# Patient Record
Sex: Female | Born: 1965 | Race: White | Hispanic: No | Marital: Married | Smoking: Never smoker
Health system: Southern US, Community
[De-identification: ages and names within clinical notes are randomized; demographics above are authoritative.]

## PROBLEM LIST (undated history)

## (undated) DIAGNOSIS — R55 Syncope and collapse: Secondary | ICD-10-CM

## (undated) DIAGNOSIS — N920 Excessive and frequent menstruation with regular cycle: Secondary | ICD-10-CM

## (undated) DIAGNOSIS — Z973 Presence of spectacles and contact lenses: Secondary | ICD-10-CM

## (undated) DIAGNOSIS — Z8759 Personal history of other complications of pregnancy, childbirth and the puerperium: Secondary | ICD-10-CM

## (undated) DIAGNOSIS — R112 Nausea with vomiting, unspecified: Secondary | ICD-10-CM

## (undated) DIAGNOSIS — N840 Polyp of corpus uteri: Secondary | ICD-10-CM

## (undated) DIAGNOSIS — Z9889 Other specified postprocedural states: Secondary | ICD-10-CM

## (undated) DIAGNOSIS — M1990 Unspecified osteoarthritis, unspecified site: Secondary | ICD-10-CM

## (undated) HISTORY — DX: Unspecified osteoarthritis, unspecified site: M19.90

## (undated) HISTORY — PX: TONSILLECTOMY: SUR1361

## (undated) HISTORY — DX: Syncope and collapse: R55

---
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
## 2000-05-03 ENCOUNTER — Other Ambulatory Visit: Admission: RE | Admit: 2000-05-03 | Discharge: 2000-05-03 | Payer: Self-pay | Admitting: *Deleted

## 2000-05-27 ENCOUNTER — Encounter: Payer: Self-pay | Admitting: *Deleted

## 2000-05-27 ENCOUNTER — Ambulatory Visit (HOSPITAL_COMMUNITY): Admission: RE | Admit: 2000-05-27 | Discharge: 2000-05-27 | Payer: Self-pay | Admitting: *Deleted

## 2001-02-05 ENCOUNTER — Ambulatory Visit (HOSPITAL_COMMUNITY): Admission: RE | Admit: 2001-02-05 | Discharge: 2001-02-05 | Payer: Self-pay | Admitting: *Deleted

## 2001-02-05 HISTORY — PX: PELVIC LAPAROSCOPY: SHX162

## 2001-11-17 ENCOUNTER — Other Ambulatory Visit: Admission: RE | Admit: 2001-11-17 | Discharge: 2001-11-17 | Payer: Self-pay | Admitting: *Deleted

## 2002-05-30 ENCOUNTER — Inpatient Hospital Stay (HOSPITAL_COMMUNITY): Admission: AD | Admit: 2002-05-30 | Discharge: 2002-06-02 | Payer: Self-pay | Admitting: *Deleted

## 2002-05-30 ENCOUNTER — Encounter (INDEPENDENT_AMBULATORY_CARE_PROVIDER_SITE_OTHER): Payer: Self-pay | Admitting: Specialist

## 2002-06-12 ENCOUNTER — Encounter (HOSPITAL_COMMUNITY): Admission: RE | Admit: 2002-06-12 | Discharge: 2002-07-12 | Payer: Self-pay | Admitting: Gynecology

## 2005-03-07 ENCOUNTER — Other Ambulatory Visit: Admission: RE | Admit: 2005-03-07 | Discharge: 2005-03-07 | Payer: Self-pay | Admitting: Gynecology

## 2006-11-13 ENCOUNTER — Other Ambulatory Visit: Admission: RE | Admit: 2006-11-13 | Discharge: 2006-11-13 | Payer: Self-pay | Admitting: Obstetrics and Gynecology

## 2007-12-08 ENCOUNTER — Other Ambulatory Visit: Admission: RE | Admit: 2007-12-08 | Discharge: 2007-12-08 | Payer: Self-pay | Admitting: Gynecology

## 2008-12-17 ENCOUNTER — Ambulatory Visit: Payer: Self-pay | Admitting: Women's Health

## 2008-12-17 ENCOUNTER — Encounter: Payer: Self-pay | Admitting: Women's Health

## 2008-12-17 ENCOUNTER — Other Ambulatory Visit: Admission: RE | Admit: 2008-12-17 | Discharge: 2008-12-17 | Payer: Self-pay | Admitting: Obstetrics and Gynecology

## 2010-02-22 ENCOUNTER — Other Ambulatory Visit: Admission: RE | Admit: 2010-02-22 | Discharge: 2010-02-22 | Payer: Self-pay | Admitting: Obstetrics and Gynecology

## 2010-02-22 ENCOUNTER — Ambulatory Visit: Payer: Self-pay | Admitting: Women's Health

## 2011-02-16 NOTE — Discharge Summary (Signed)
   NAME:  Cathy Kirby, Cathy Kirby                   ACCOUNT NO.:  [id number]   MEDICAL RECORD NO.:  [id number]                   PATIENT TYPE:  INP   LOCATION:  9139                                 [facility]ILITY:  [facility]   PHYSICIAN:  Timothy P. Fontaine, M.D.           DATE OF BIRTH:  1965/11/13   DATE OF ADMISSION:  05/30/2002  DATE OF DISCHARGE:  06/02/2002                                 DISCHARGE SUMMARY   DISCHARGE DIAGNOSES:  1. Pregnancy 36 weeks.  2. Premature rupture of membranes.  3. Subsequent contractions.  4. History of previous cesarean section, desires repeat.   PROCEDURE:  Repeat low cervical transverse cesarean section with delivery of  viable infant.   HISTORY OF PRESENT ILLNESS:  The patient is a 45 year old gravida 4, para 1-  0-1-1, LMP September 18, 2001, [facility] June 23, 2002.  Prenatal risk factors  include history of previous ectopic pregnancy, history of previous cesarean  section, history of positive GBS with first pregnancy.   LABORATORIES:  Blood type O+.  Antibody screen negative.  RPR, HBSAG,  HIV  nonreactive.   HOSPITAL COURSE:  The patient was admitted at [redacted] weeks gestation secondary  to premature rupture of membranes and contractions for repeat cesarean  section.  Procedure was performed by Dr. Colin Broach with scrub  technician as assistant.  Findings included delivery of normal female infant,  Apgars 9/9, weight 6 pounds 5 ounces.  Left tube and ovary noted to be  absent.  Right adnexa normal.  Uterus was grossly normal.  Postpartum course  patient remained afebrile.  Had no difficulty voiding.  Was able to be  discharged in satisfactory condition on her third postoperative day.  Postoperative hemoglobin 11.   DISPOSITION:  Follow up in six weeks.  Continue prenatal vitamins and iron,  over-the-counter Motrin for pain.     Elwyn Lade . Hancock, N.P.                Timothy P. Audie Box, M.D.    MKH/MEDQ  D:  07/09/2002  T:  07/10/2002  Job:   [id number]

## 2011-02-16 NOTE — H&P (Signed)
[facility]  Patient:    Cathy Kirby, Cathy Kirby                  MRN: [id number] Adm. Date:  01/30/01 Attending:  Katy Fitch, M.D.                         History and Physical  CHIEF COMPLAINT:              Secondary infertility.  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 The patient had a cesarean section in 1992 with a normal pregnancy, and the following year she had a laparoscopy done for a tubal pregnancy on the right side, for which she was also treated with methotrexate. The year after that, the patient stated that she did have a spontaneous miscarriage and that she did have a pregnancy documented on ultrasound after this tubal surgery. The patient had a left salpingo-oophorectomy as a child due to an ovarian cyst and torsion. The patient was seen in the office and worked up for her secondary infertility. The patients husband has a normal semen analysis and she is ovulatory with a midluteal progesterone of 12.8. The patient also had a hysterosalpingogram which was seen to be patent right tube without any evidence of occlusion or hydrosalpinx. The patient is having periods currently every 28 days with four days of flow and no other problems.  PAST MEDICAL HISTORY:         None.  PAST SURGICAL HISTORY:        In 1982, laparotomy for LSO for cyst and torsion. 1992: C-section. 1993: Laparoscopy for ectopic.  MEDICATIONS:                  Multivitamin.  ALLERGIES:                    None.  SOCIAL HISTORY:               Patient is married. She denies any tobacco, alcohol, or drugs.  FAMILY HISTORY:               Without any epithelial cancers.  PHYSICAL EXAMINATION:  VITAL SIGNS:                  Blood pressure 112/78.  HEENT:                        Throat clear.  NECK:                         Without any thyromegaly or JVD.  LUNGS:                         Clear to auscultation bilaterally.  HEART:                        Regular rate and rhythm without any murmurs, rubs, or gallops.  BREASTS:                      Symmetric, nontender.  ABDOMEN:                      Soft, nontender without masses.  CERVIX:  Without any gross lesions.  UTERUS:                       Firm, mobile, nontender. Adnexa without any masses, nontender.  ASSESSMENT AND PLAN:          This is a 45 year old G3, P1, ectopic 1, with secondary infertility. The patient most likely has a tubal factor. The patient will undergo laparoscopic evaluation with CO2 laser standby and chromopertubation. Risks of procedure explained to patient including bleeding, transfusion, infection, scar tissue, wound breakdown, damage to the internal organs, fistula formation, possible continued infertility, anesthetic complications, blood clots, stroke, pulmonary embolism, and death. It was also explained to that patient if there is a hydrosalpinx noted, that we will probably occlude the tube secondary to her having a risk for ectopic pregnancy and if she desires to do in vitro fertilization in the future, that she will have a higher success rate with the occlusion. The patient does not want to undergo any laparotomy if scar tissue is present and there is any possible means of having to open the abdomen to remove scar tissue; that she would rather leave it and not undergo that procedure for her fertility. The patient states that she has read a lot about in vitro and at this point, does want to undergo this; however, she states that occluding the tube would be something that she would be agreeable to secondary to potential use in the future with in vitro and for sterility reasons. DD:  01/30/01 TD:  01/30/01 Job: [id number]

## 2011-02-16 NOTE — Op Note (Signed)
NAME:  Cathy Kirby, Cathy Kirby                   ACCOUNT NO.:  [id number]   MEDICAL RECORD NO.:  [id number]                   PATIENT TYPE:  INP   LOCATION:  NA                                   [facility]ILITY:  [facility]   PHYSICIAN:  Timothy P. Fontaine, M.D.           DATE OF BIRTH:  1965/12/31   DATE OF PROCEDURE:  05/30/2002  DATE OF DISCHARGE:                                 OPERATIVE REPORT   PREOPERATIVE DIAGNOSES:  1. Pregnancy at 36 weeks' gestation.  2. Premature rupture of membranes, subsequent contractions.  3. Prior cesarean section; desires repeat cesarean section.   POSTOPERATIVE DIAGNOSES:  1. Pregnancy at 36 weeks' gestation.  2. Premature rupture of membranes, subsequent contractions.  3. Prior cesarean section; desires repeat cesarean section.   PROCEDURE:  Repeat low transverse cervical cesarean section.   SURGEON:  Timothy P. Fontaine, M.D.   ASSISTANT:  Scrub technician.   ANESTHESIA:  Spinal.   ESTIMATED BLOOD LOSS:  Less than 500 cc.   COMPLICATIONS:  None.   SPECIMENS:  Samples of cord blood, placenta.   FINDINGS:  At 46 normal female infant, Apgars 9 and 9.  Weight 6 pounds, 5  ounces.  Left tube and ovary noted to be absent.  Right adnexa normal.  Uterus grossly normal.   DESCRIPTION OF PROCEDURE:  The patient was taken to the operating room and  underwent spinal anesthesia.  She was placed in the left tilt supine  position.  Her panniculus was taped to expose her prior low transverse scar,  and the abdomen was prepared with Betadine solution.  The bladder was  emptied with an indwelling Foley catheter and placed in sterile technique.  The patient was draped in the usual fashion.  After assuring adequate  anesthesia, the abdomen was sharply entered through a repeat Pfannenstiel  incision, achieving adequate anesthesia at all levels.  The bladder flap was  sharply and bluntly developed without difficulty, and the uterus was sharply  entered in the lower  uterine segment and bluntly extended laterally.  The  bulging membranes were ruptured.  Fluid was noted to be clear.  The infant's  head was delivered through the incision and nares and mouth suctioned.  The  rest of the infant delivered.  The cord was clamped and cut, and the infant  handed to pediatrics in attendance.  Samples of cord blood were obtained.  The placenta was spontaneously extruded and noted to be intact and was sent  to pathology.  The patient received 1 g of Cefotan IV prophylaxis at this  time.  The uterus was exteriorized noting an absent left adnexa.  The  endometrial cavity was explored with a sponge to remove all placental and  membrane fragments.  The uterine incision was then closed in one layer using  0 Vicryl suture in a running stitch and an interrupted figure-of-eight  suture was placed in the midline to achieve ultimate hemostasis.  The uterus  was then  returned to the abdomen which was copiously irrigated.  Adequate  hemostasis visualized.  The anterior fascia was reapproximated using 0  Vicryl suture in a running stitch starting at the angle and meeting in the  middle.  The subcutaneous tissues were irrigated.  Adequate hemostasis was  achieved with electrocautery, and the skin was reapproximated using staples  with intervening Steri-Strips with Benzoin.  The patient's sterile dressing  was applied.  She was taken to the recovery room in good condition having  tolerated the procedure well.                                               Timothy P. Audie Box, M.D.    TPF/MEDQ  D:  05/30/2002  T:  06/01/2002  Job:  [id number]

## 2011-02-16 NOTE — Op Note (Signed)
[facility]  Patient:    Cathy Kirby, Cathy Kirby                  MRN: [id number] Proc. Date: 02/05/01 Adm. Date:  [id number] Attending:  Wetzel Bjornstad                           Operative Report  PREOPERATIVE DIAGNOSIS:       Secondary infertility, rule out tubal factor.  POSTOPERATIVE DIAGNOSIS:      Secondary infertility, rule out tubal factor.  OPERATION:                    Diagnostic laparoscopy.  SURGEON:                      Katy Fitch, M.D.  ANESTHESIA:                   General.  ESTIMATED BLOOD LOSS:         Less than 10 cc.  FLUIDS:                       1100 crystalloid.  Urine output 300 clear.  COMPLICATIONS:                None.  SPECIMENS:                    None.  FINDINGS:                     Normal uterus.  Left tube and ovaries surgically removed.  Right tube and ovary appeared normal.  Bowel is normal.  Liver edge smooth and normal.  Anterior and posterior cul-de-sac free of adhesions or endometriosis.  DESCRIPTION OF PROCEDURE:     The patient was taken to the operating room and placed in [place] stirrups.  The patient was given general anesthesia until adequate.  The patient was then prepped and draped in normal sterile fashion. A Foley catheter was placed in the bladder, and clear urine return was noted. A bivalve speculum was placed in the vagina and the anterior lip of the cervix grasped with a single-tooth tenaculum, and an acorn uterine manipulator was placed inside the uterus and attached to the single-tooth tenaculum.  A vertical umbilical incision was made with a #11 blade, and the subcutaneous tissue was dissected with a hemostat down to the fascia.  The fascia was grasped with Kocher clamps and tented up to the incision and incised with a #15 blade.  Two 0 Vicryl sutures were placed on either side of the fascial incision for retention, and Army-Navy retractors were placed down inside, and entrance into  the peritoneal cavity was noted.   A Hasson 10 mm port was then placed into the umbilical incision and tied down using 0 Vicryl sutures. Carbon dioxide gas was instilled through the Hasson, and the camera device was assembled, weight balanced, and placed into the abdominal cavity.  On inspection, it was noted that there were some periumbilical adhesions initially but were filmy and were taken down with the tip of the camera.  Then clear inspection of the pelvis was noted.  The left tube and ovary were surgically removed.  The anterior and posterior cul-de-sac were clear of disease.  The right tube and ovary appeared normal.  A 5 mm suprapubic port was placed under direct visualization using a  skin knife, and then a 5 mm port was placed, and a probe was used to remove the sigmoid bowel out of the way in the posterior cul-de-sac to get adequate visualization.  The ovarian fossa on the left and right appeared to be normal.  Attempts at chromopertubation were made; however, dye spilled around the acorn out of the cervix and was not able to be performed.  However, the patient did have a prior HSG which showed normal spillage of the tube on the right side.  The patient had no other adhesions, endometriosis, or operative findings, so the 5 mm port was then removed under direct visualization, and hemostasis was noted.  Then the 10 mm port was removed.  The 0 Vicryl sutures were then tied down, and the fascia layer was closed.  The skin was closed using a 4-0 Vicryl in a subcuticular stitch, and the patient was taken to the recovery room in stable condition. DD:  02/05/01 TD:  02/05/01 Job: [id number]

## 2011-04-24 ENCOUNTER — Other Ambulatory Visit: Payer: Self-pay | Admitting: *Deleted

## 2011-04-24 DIAGNOSIS — N943 Premenstrual tension syndrome: Secondary | ICD-10-CM

## 2011-04-24 MED ORDER — FLUOXETINE HCL 10 MG PO CAPS: 10.0000 mg | ORAL_CAPSULE | Freq: Every day | ORAL | Status: DC

## 2011-04-24 NOTE — Telephone Encounter (Signed)
rx called in instead of faxed in. Due to protocol. kw

## 2011-05-28 ENCOUNTER — Other Ambulatory Visit: Payer: Self-pay | Admitting: Women's Health

## 2011-06-05 DIAGNOSIS — R768 Other specified abnormal immunological findings in serum: Secondary | ICD-10-CM | POA: Insufficient documentation

## 2011-06-05 DIAGNOSIS — R7689 Other specified abnormal immunological findings in serum: Secondary | ICD-10-CM | POA: Insufficient documentation

## 2011-06-05 DIAGNOSIS — Z9852 Vasectomy status: Secondary | ICD-10-CM | POA: Insufficient documentation

## 2011-06-13 ENCOUNTER — Ambulatory Visit (INDEPENDENT_AMBULATORY_CARE_PROVIDER_SITE_OTHER): Payer: BC Managed Care – PPO | Admitting: Women's Health

## 2011-06-13 ENCOUNTER — Other Ambulatory Visit (HOSPITAL_COMMUNITY)
Admission: RE | Admit: 2011-06-13 | Discharge: 2011-06-13 | Disposition: A | Discharge status: Home / Self Care | Payer: BC Managed Care – PPO | Source: Ambulatory Visit | Attending: Women's Health | Admitting: Women's Health

## 2011-06-13 ENCOUNTER — Encounter: Payer: Self-pay | Admitting: Women's Health

## 2011-06-13 VITALS — BP 120/80 | Ht 62.0 in | Wt 208.0 lb

## 2011-06-13 DIAGNOSIS — F419 Anxiety disorder, unspecified: Secondary | ICD-10-CM

## 2011-06-13 DIAGNOSIS — F411 Generalized anxiety disorder: Secondary | ICD-10-CM

## 2011-06-13 DIAGNOSIS — Z01419 Encounter for gynecological examination (general) (routine) without abnormal findings: Secondary | ICD-10-CM | POA: Insufficient documentation

## 2011-06-13 DIAGNOSIS — R82998 Other abnormal findings in urine: Secondary | ICD-10-CM

## 2011-06-13 DIAGNOSIS — Z833 Family history of diabetes mellitus: Secondary | ICD-10-CM

## 2011-06-13 MED ORDER — FLUOXETINE HCL 10 MG PO CAPS: 10.0000 mg | ORAL_CAPSULE | Freq: Every day | ORAL | Status: DC

## 2011-06-13 NOTE — Progress Notes (Signed)
Cathy Kirby [birth year] [id number]    History:    The patient presents for annual exam.  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 44 year old son Cathy Kirby doing well at [employer] 76-year-old son Cathy Kirby is in third-grade.   Past medical history, past surgical history, family history and social history were all reviewed and documented in the EPIC chart.   ROS:  A  ROS was performed and pertinent positives and negatives are included in the history.  Exam:  Filed Vitals:   06/13/11 1526  BP: 120/80    General appearance:  Normal Head/Neck:  Normal, without cervical or supraclavicular adenopathy. Thyroid:  Symmetrical, normal in size, without palpable masses or nodularity. Respiratory  Effort:  Normal  Auscultation:  Clear without wheezing or rhonchi Cardiovascular  Auscultation:  Regular rate, without rubs, murmurs or gallops  Edema/varicosities:  Not grossly evident Abdominal  Soft,nontender, without masses, guarding or rebound.  Liver/spleen:  No organomegaly noted  Hernia:  None appreciated  Skin  Inspection:  Grossly normal  Palpation:  Grossly normal Neurologic/psychiatric  Orientation:  Normal with appropriate conversation.  Mood/affect:  Normal  Genitourinary    Breasts: Examined lying and sitting.     Right: Without masses, retractions, discharge or axillary adenopathy.     Left: Without masses, retractions, discharge or axillary adenopathy.   Inguinal/mons:  Normal without inguinal adenopathy  External genitalia:  Normal  BUS/Urethra/Skene's glands:  Normal  Bladder:  Normal  Vagina:  Normal  Cervix:  Normal  Uterus:  , normal in size, shape and contour.  Midline and mobile  Adnexa/parametria:     Rt: Without masses or tenderness.   Lt: Without masses or tenderness.  Anus and perineum: Normal  Digital rectal exam: Normal sphincter tone without palpated masses or tenderness  Assessment/Plan:  45 y.o. MWF G4 P2 for annual exam. Monthly 3-4  day cycle/ vasectomy, no complaints. SBEs, has not had a mammogram, and did review importance of an annual screening, breast center information was given with phone number will get scheduled. Long discussion on weight loss, Weight Watchers was encouraged decreasing calories, increasing exercise for weight loss. CBC, glucose, UA and Pap. Will return to the office for a fasting lipid profile. She has had an elevated triglyceride in the past was seen by primary care who did not want to initiate medication. She is currently taking fish oil and will continue, increase fiber rich foods to greater than 20 g per day was encouraged and decreasing saturated fat to less than 20 g per day.     Harrington Challenger [facility], 5:18 PM 06/13/2011

## 2011-06-13 NOTE — Patient Instructions (Addendum)
<  20g saturated fat   >20g fiber /day

## 2011-06-29 ENCOUNTER — Other Ambulatory Visit: Payer: Self-pay | Admitting: Women's Health

## 2011-11-01 ENCOUNTER — Encounter: Payer: Self-pay | Admitting: *Deleted

## 2011-11-01 NOTE — Progress Notes (Signed)
Patient ID: Cathy Kirby, female   DOB: 1966/03/20, 46 y.o.   MRN: [id number] Pt called wanting to know the name and number of last mammogram. [facility] [phone] left on pt voicemail.

## 2011-11-08 ENCOUNTER — Encounter: Payer: Self-pay | Admitting: Women's Health

## 2012-11-25 ENCOUNTER — Other Ambulatory Visit: Payer: Self-pay | Admitting: Women's Health

## 2012-11-27 ENCOUNTER — Other Ambulatory Visit: Payer: Self-pay | Admitting: Women's Health

## 2012-12-15 ENCOUNTER — Ambulatory Visit: Payer: BC Managed Care – PPO | Admitting: Women's Health

## 2012-12-15 ENCOUNTER — Encounter: Payer: Self-pay | Admitting: Women's Health

## 2012-12-15 VITALS — BP 124/74 | Ht 62.25 in | Wt 203.0 lb

## 2012-12-15 DIAGNOSIS — F329 Major depressive disorder, single episode, unspecified: Secondary | ICD-10-CM

## 2012-12-15 DIAGNOSIS — Z01419 Encounter for gynecological examination (general) (routine) without abnormal findings: Secondary | ICD-10-CM

## 2012-12-15 DIAGNOSIS — F32A Depression, unspecified: Secondary | ICD-10-CM

## 2012-12-15 MED ORDER — FLUOXETINE HCL 10 MG PO CAPS: 10.0000 mg | ORAL_CAPSULE | Freq: Every day | ORAL | Status: DC

## 2012-12-15 NOTE — Progress Notes (Addendum)
Cathy Kirby 03-01-1966 [id number]    History:    The patient presents for annual exam.  Monthly 3-4d  Cycle/vasectomy. Normal mammogram and Pap history. Had normal labs at primary care this past year. Positive ANA/2006, negative workup with rheumatologist.  Past medical history, past surgical history, family history and social history were all reviewed and documented in the EPIC chart. Works with  early intervention birth to 3/developmental therapy. Cathy Kirby [address],  [place] 10 both doing well. Ectopic 89, LSO for cyst and torsion 83. Father diabetes, died of lung cancer.   ROS:  A  ROS was performed and pertinent positives and negatives are included in the history.  Exam:  Filed Vitals:   12/15/12 1405  BP: 124/74    General appearance:  Normal Head/Neck:  Normal, without cervical or supraclavicular adenopathy. Thyroid:  Symmetrical, normal in size, without palpable masses or nodularity. Respiratory  Effort:  Normal  Auscultation:  Clear without wheezing or rhonchi Cardiovascular  Auscultation:  Regular rate, without rubs, murmurs or gallops  Edema/varicosities:  Not grossly evident Abdominal  Soft,nontender, without masses, guarding or rebound.  Liver/spleen:  No organomegaly noted  Hernia:  None appreciated  Skin  Inspection:  Grossly normal  Palpation:  Grossly normal Neurologic/psychiatric  Orientation:  Normal with appropriate conversation.  Mood/affect:  Normal  Genitourinary    Breasts: Examined lying and sitting.     Right: Without masses, retractions, discharge or axillary adenopathy.     Left: Without masses, retractions, discharge or axillary adenopathy.   Inguinal/mons:  Normal without inguinal adenopathy  External genitalia:  Normal  BUS/Urethra/Skene's glands:  Normal  Bladder:  Normal  Vagina:  Normal  Cervix:  Normal  Uterus:   normal in size, shape and contour.  Midline and mobile  Adnexa/parametria:     Rt: Without masses or  tenderness.   Lt: Without masses or tenderness.  Anus and perineum: Normal  Digital rectal exam: Normal sphincter tone without palpated masses or tenderness  Assessment/Plan:  47 y.o. MWF G4P2 for annual exam with no complaints.  Normal GYN exam/vasectomy Normal labs at primary care Mild depression stable on Prozac 10 mg  Plan: SBE's, continue annual mammogram, calcium rich diet, vitamin D 1000 daily encouraged. Reviewed importance of decreasing calories and increasing exercise for weight loss. Pap, last Pap 06/2011,  New screening guidelines reviewed. Prozac 10 mg by mouth daily, prescription, proper use given and reviewed declines need for counseling.     Harrington Challenger [facility], 4:12 PM 12/15/2012

## 2012-12-15 NOTE — Patient Instructions (Signed)

## 2012-12-30 HISTORY — PX: FOOT SURGERY: SHX648

## 2013-03-10 ENCOUNTER — Other Ambulatory Visit: Payer: Self-pay | Admitting: Women's Health

## 2013-03-10 MED ORDER — FLUOXETINE HCL 10 MG PO CAPS: 10.0000 mg | ORAL_CAPSULE | Freq: Every day | ORAL | Status: DC

## 2013-12-18 ENCOUNTER — Encounter: Payer: Self-pay | Admitting: Women's Health

## 2013-12-18 ENCOUNTER — Other Ambulatory Visit (HOSPITAL_COMMUNITY)
Admission: RE | Admit: 2013-12-18 | Discharge: 2013-12-18 | Disposition: A | Discharge status: Home / Self Care | Payer: BC Managed Care – PPO | Source: Ambulatory Visit | Attending: Gynecology | Admitting: Gynecology

## 2013-12-18 ENCOUNTER — Ambulatory Visit (INDEPENDENT_AMBULATORY_CARE_PROVIDER_SITE_OTHER): Payer: BC Managed Care – PPO | Admitting: Women's Health

## 2013-12-18 VITALS — BP 110/70 | Ht 61.5 in | Wt 192.0 lb

## 2013-12-18 DIAGNOSIS — Z833 Family history of diabetes mellitus: Secondary | ICD-10-CM

## 2013-12-18 DIAGNOSIS — E079 Disorder of thyroid, unspecified: Secondary | ICD-10-CM

## 2013-12-18 DIAGNOSIS — F3289 Other specified depressive episodes: Secondary | ICD-10-CM

## 2013-12-18 DIAGNOSIS — F329 Major depressive disorder, single episode, unspecified: Secondary | ICD-10-CM

## 2013-12-18 DIAGNOSIS — Z13220 Encounter for screening for lipoid disorders: Secondary | ICD-10-CM

## 2013-12-18 DIAGNOSIS — Z01419 Encounter for gynecological examination (general) (routine) without abnormal findings: Secondary | ICD-10-CM

## 2013-12-18 DIAGNOSIS — Z1151 Encounter for screening for human papillomavirus (HPV): Secondary | ICD-10-CM | POA: Insufficient documentation

## 2013-12-18 DIAGNOSIS — F32A Depression, unspecified: Secondary | ICD-10-CM

## 2013-12-18 LAB — CBC WITH DIFFERENTIAL/PLATELET
Basophils Absolute: 0.1 10*3/uL (ref 0.0–0.1)
Basophils Relative: 1 % (ref 0–1)
Eosinophils Absolute: 0.3 10*3/uL (ref 0.0–0.7)
Eosinophils Relative: 3 % (ref 0–5)
HCT: 41.8 % (ref 36.0–46.0)
Hemoglobin: 14.2 g/dL (ref 12.0–15.0)
Lymphocytes Relative: 42 % (ref 12–46)
Lymphs Abs: 4.7 10*3/uL — ABNORMAL HIGH (ref 0.7–4.0)
MCH: 29.2 pg (ref 26.0–34.0)
MCHC: 34 g/dL (ref 30.0–36.0)
MCV: 86 fL (ref 78.0–100.0)
Monocytes Absolute: 1 10*3/uL (ref 0.1–1.0)
Monocytes Relative: 9 % (ref 3–12)
Neutro Abs: 5 10*3/uL (ref 1.7–7.7)
Neutrophils Relative %: 45 % (ref 43–77)
Platelets: 311 10*3/uL (ref 150–400)
RBC: 4.86 MIL/uL (ref 3.87–5.11)
RDW: 13.3 % (ref 11.5–15.5)
WBC: 11.1 10*3/uL — ABNORMAL HIGH (ref 4.0–10.5)

## 2013-12-18 MED ORDER — FLUOXETINE HCL 10 MG PO CAPS: 10.0000 mg | ORAL_CAPSULE | Freq: Every day | ORAL | Status: DC

## 2013-12-18 NOTE — Progress Notes (Signed)
Thersia L Motta-Payne 1966/01/09 [id number]    History:    Presents for annual exam.  Monthly cycles/vasectomy. Has noticed some changes with some cycles heavier or lighter than others in the past year. History of a positive ANA with a negative workup with rheumatologist. 1982 LSO for torsion. 1992 ectopic. Normal Pap and mammogram history.  Past medical history, past surgical history, family history and social history were all reviewed and documented in the EPIC chart. 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 Father diabetes, lung cancer survived.  ROS:  A  ROS was performed and pertinent positives and negatives are included.  Exam:  Filed Vitals:   12/18/13 1523  BP: 110/70    General appearance:  Normal Thyroid:  Symmetrical, normal in size, without palpable masses or nodularity. Respiratory  Auscultation:  Clear without wheezing or rhonchi Cardiovascular  Auscultation:  Regular rate, without rubs, murmurs or gallops  Edema/varicosities:  Not grossly evident Abdominal  Soft,nontender, without masses, guarding or rebound.  Liver/spleen:  No organomegaly noted  Hernia:  None appreciated  Skin  Inspection:  Grossly normal   Breasts: Examined lying and sitting.     Right: Without masses, retractions, discharge or axillary adenopathy.     Left: Without masses, retractions, discharge or axillary adenopathy. Gentitourinary   Inguinal/mons:  Normal without inguinal adenopathy  External genitalia:  Normal  BUS/Urethra/Skene's glands:  Normal  Vagina:  Normal  Cervix:  Normal  Uterus:   normal in size, shape and contour.  Midline and mobile  Adnexa/parametria:     Rt: Without masses or tenderness.   Lt: Without masses or tenderness.  Anus and perineum: Normal  Digital rectal exam: Normal sphincter tone without palpated masses or tenderness  Assessment/Plan:  48 y.o.MWF G4P2  for annual exam with no  complaints.  Normal GYN exam/vasectomy Overweight  Plan: SBE's, annual mammogram, overdue reviewed importance of annual screen. Increase regular exercise, decrease calories for continued weight loss (down 14 pounds). CBC, glucose, lipid panel, TSH, UA, Pap. Normal Pap 2012, new screening guidelines reviewed.   Harrington ChallengerYOUNG,Kohl Polinsky J WHNP, 5:22 PM 12/18/2013

## 2013-12-18 NOTE — Patient Instructions (Signed)

## 2013-12-19 LAB — URINALYSIS W MICROSCOPIC + REFLEX CULTURE
Bilirubin Urine: NEGATIVE
Casts: NONE SEEN
Crystals: NONE SEEN
Glucose, UA: NEGATIVE mg/dL
Hgb urine dipstick: NEGATIVE
Ketones, ur: NEGATIVE mg/dL
Leukocytes, UA: NEGATIVE
Nitrite: NEGATIVE
Protein, ur: NEGATIVE mg/dL
Specific Gravity, Urine: >1.03 — ABNORMAL HIGH (ref 1.005–1.030)
Urobilinogen, UA: 0.2 mg/dL (ref 0.0–1.0)
pH: 6.5 (ref 5.0–8.0)

## 2013-12-19 LAB — LIPID PANEL
Cholesterol: 230 mg/dL — ABNORMAL HIGH (ref 0–200)
HDL: 53 mg/dL (ref >39)
LDL Cholesterol: 148 mg/dL — ABNORMAL HIGH (ref 0–99)
Total CHOL/HDL Ratio: 4.3 Ratio
Triglycerides: 147 mg/dL (ref <150)
VLDL: 29 mg/dL (ref 0–40)

## 2013-12-19 LAB — GLUCOSE, RANDOM: Glucose, Bld: 91 mg/dL (ref 70–99)

## 2013-12-19 LAB — TSH: TSH: 1.39 u[IU]/mL (ref 0.350–4.500)

## 2013-12-21 ENCOUNTER — Encounter: Payer: Self-pay | Admitting: Women's Health

## 2014-02-25 ENCOUNTER — Other Ambulatory Visit: Payer: Self-pay | Admitting: Women's Health

## 2014-08-02 ENCOUNTER — Encounter: Payer: Self-pay | Admitting: Women's Health

## 2015-03-22 ENCOUNTER — Encounter: Payer: Self-pay | Admitting: Women's Health

## 2015-03-22 ENCOUNTER — Ambulatory Visit (INDEPENDENT_AMBULATORY_CARE_PROVIDER_SITE_OTHER): Payer: BC Managed Care – PPO | Admitting: Women's Health

## 2015-03-22 VITALS — BP 124/80 | Ht 62.0 in | Wt 192.0 lb

## 2015-03-22 DIAGNOSIS — Z01419 Encounter for gynecological examination (general) (routine) without abnormal findings: Secondary | ICD-10-CM | POA: Diagnosis not present

## 2015-03-22 DIAGNOSIS — F4323 Adjustment disorder with mixed anxiety and depressed mood: Secondary | ICD-10-CM | POA: Diagnosis not present

## 2015-03-22 LAB — CBC WITH DIFFERENTIAL/PLATELET
Basophils Absolute: 0.1 10*3/uL (ref 0.0–0.1)
Basophils Relative: 1 % (ref 0–1)
Eosinophils Absolute: 0.2 10*3/uL (ref 0.0–0.7)
Eosinophils Relative: 2 % (ref 0–5)
HCT: 43 % (ref 36.0–46.0)
Hemoglobin: 14.4 g/dL (ref 12.0–15.0)
Lymphocytes Relative: 44 % (ref 12–46)
Lymphs Abs: 4.1 10*3/uL — ABNORMAL HIGH (ref 0.7–4.0)
MCH: 29.1 pg (ref 26.0–34.0)
MCHC: 33.5 g/dL (ref 30.0–36.0)
MCV: 86.9 fL (ref 78.0–100.0)
MPV: 11.3 fL (ref 8.6–12.4)
Monocytes Absolute: 0.9 10*3/uL (ref 0.1–1.0)
Monocytes Relative: 10 % (ref 3–12)
Neutro Abs: 4 10*3/uL (ref 1.7–7.7)
Neutrophils Relative %: 43 % (ref 43–77)
Platelets: 287 10*3/uL (ref 150–400)
RBC: 4.95 MIL/uL (ref 3.87–5.11)
RDW: 13.1 % (ref 11.5–15.5)
WBC: 9.4 10*3/uL (ref 4.0–10.5)

## 2015-03-22 LAB — COMPREHENSIVE METABOLIC PANEL
ALT: 14 U/L (ref 0–35)
AST: 15 U/L (ref 0–37)
Albumin: 4.1 g/dL (ref 3.5–5.2)
Alkaline Phosphatase: 48 U/L (ref 39–117)
BUN: 24 mg/dL — ABNORMAL HIGH (ref 6–23)
CO2: 23 mEq/L (ref 19–32)
Calcium: 9.2 mg/dL (ref 8.4–10.5)
Chloride: 105 mEq/L (ref 96–112)
Creat: 0.9 mg/dL (ref 0.50–1.10)
Glucose, Bld: 78 mg/dL (ref 70–99)
Potassium: 4.4 mEq/L (ref 3.5–5.3)
Sodium: 139 mEq/L (ref 135–145)
Total Bilirubin: 0.4 mg/dL (ref 0.2–1.2)
Total Protein: 7 g/dL (ref 6.0–8.3)

## 2015-03-22 LAB — LIPID PANEL
Cholesterol: 221 mg/dL — ABNORMAL HIGH (ref 0–200)
HDL: 43 mg/dL — ABNORMAL LOW (ref >=46)
LDL Cholesterol: 150 mg/dL — ABNORMAL HIGH (ref 0–99)
Total CHOL/HDL Ratio: 5.1 Ratio
Triglycerides: 140 mg/dL (ref <150)
VLDL: 28 mg/dL (ref 0–40)

## 2015-03-22 MED ORDER — FLUOXETINE HCL 10 MG PO CAPS: ORAL_CAPSULE | ORAL | Status: DC

## 2015-03-22 NOTE — Patient Instructions (Signed)

## 2015-03-22 NOTE — Progress Notes (Signed)
Cathy Kirby 04-14-1966 [id number]    History:    Presents for annual exam.  Monthly cycles/vasectomy. 1982 LSO for torsion, 89 ectopic. Normal Pap and mammogram history. Positive ANA with a negative workup with rheumatologist.  Past medical history, past surgical history, family history and social history were all reviewed and documented in the EPIC chart. 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 Oldest son graduated from [employer] currently in accelerated nursing program, married. [name] 12 doing well. Father diabetes lung cancer survivor.  ROS:  A ROS was performed and pertinent positives and negatives are included.  Exam:  Filed Vitals:   03/22/15 1530  BP: 124/80    General appearance:  Normal Thyroid:  Symmetrical, normal in size, without palpable masses or nodularity. Respiratory  Auscultation:  Clear without wheezing or rhonchi Cardiovascular  Auscultation:  Regular rate, without rubs, murmurs or gallops  Edema/varicosities:  Not grossly evident Abdominal  Soft,nontender, without masses, guarding or rebound.  Liver/spleen:  No organomegaly noted  Hernia:  None appreciated  Skin  Inspection:  Grossly normal   Breasts: Examined lying and sitting.     Right: Without masses, retractions, discharge or axillary adenopathy.     Left: Without masses, retractions, discharge or axillary adenopathy. Gentitourinary   Inguinal/mons:  Normal without inguinal adenopathy  External genitalia:  Normal  BUS/Urethra/Skene's glands:  Normal  Vagina:  Normal  Cervix:  Normal  Uterus:   normal in size, shape and contour.  Midline and mobile  Adnexa/parametria:     Rt: Without masses or tenderness.   Lt: Without masses or tenderness.  Anus and perineum: Normal  Digital rectal exam: Normal sphincter tone without palpated masses or tenderness  Assessment/Plan:  49 y.o. MWF G4P2 for annual exam no complaints.  Monthly cycle/vasectomy Anxiety/depression stable on Prozac 10 mg,  increasing PMS    Plan: SBE's, annual screening mammogram overdue instructed to schedule. Decrease calories and increase exercise, calcium rich diet,  vitamin D 1000 daily encouraged. Prozac 10 mg by mouth daily day one through 14 of each month increase to Prozac 20 mg day 14 through cycle. Call if no relief denies need for counseling. CBC, CMP,TSH, lipid panel, UA, Pap normal with negative HR HPV 2015, new screening guidelines reviewed.  Harrington Challenger [facility], 5:23 PM 03/22/2015

## 2015-03-23 LAB — URINALYSIS W MICROSCOPIC + REFLEX CULTURE
Bacteria, UA: NONE SEEN
Bilirubin Urine: NEGATIVE
Casts: NONE SEEN
Crystals: NONE SEEN
Glucose, UA: NEGATIVE mg/dL
Hgb urine dipstick: NEGATIVE
Ketones, ur: NEGATIVE mg/dL
Leukocytes, UA: NEGATIVE
Nitrite: NEGATIVE
Protein, ur: NEGATIVE mg/dL
Specific Gravity, Urine: 1.013 (ref 1.005–1.030)
Squamous Epithelial / LPF: NONE SEEN
Urobilinogen, UA: 0.2 mg/dL (ref 0.0–1.0)
pH: 6 (ref 5.0–8.0)

## 2015-03-23 LAB — TSH: TSH: 1.773 u[IU]/mL (ref 0.350–4.500)

## 2015-03-25 ENCOUNTER — Other Ambulatory Visit: Payer: Self-pay

## 2015-03-25 ENCOUNTER — Telehealth: Payer: Self-pay

## 2015-03-25 DIAGNOSIS — F4323 Adjustment disorder with mixed anxiety and depressed mood: Secondary | ICD-10-CM

## 2015-03-25 MED ORDER — FLUOXETINE HCL 10 MG PO CAPS: ORAL_CAPSULE | ORAL | Status: DC

## 2015-03-25 NOTE — Telephone Encounter (Signed)
I received a fax from [facility] requesting directions be change to one daily so patient could get a 90 day supply. I spoke with pharmacist and explained she takes one daily D1-14 and then 2 daily each month. We changed the quantity to #138 which should work out correctly with ins co for 90 day supply. New Rx sent reflecting change in quantity and clarifying instructions.

## 2016-04-05 ENCOUNTER — Other Ambulatory Visit: Payer: Self-pay | Admitting: Women's Health

## 2016-06-26 ENCOUNTER — Encounter: Payer: BC Managed Care – PPO | Admitting: Women's Health

## 2016-07-27 ENCOUNTER — Ambulatory Visit (INDEPENDENT_AMBULATORY_CARE_PROVIDER_SITE_OTHER): Payer: BC Managed Care – PPO | Admitting: Women's Health

## 2016-07-27 ENCOUNTER — Encounter: Payer: Self-pay | Admitting: Women's Health

## 2016-07-27 VITALS — BP 122/78 | Ht 62.0 in | Wt 192.0 lb

## 2016-07-27 DIAGNOSIS — Z13220 Encounter for screening for lipoid disorders: Secondary | ICD-10-CM | POA: Diagnosis not present

## 2016-07-27 DIAGNOSIS — F4323 Adjustment disorder with mixed anxiety and depressed mood: Secondary | ICD-10-CM

## 2016-07-27 DIAGNOSIS — N920 Excessive and frequent menstruation with regular cycle: Secondary | ICD-10-CM

## 2016-07-27 DIAGNOSIS — Z01419 Encounter for gynecological examination (general) (routine) without abnormal findings: Secondary | ICD-10-CM

## 2016-07-27 LAB — CBC WITH DIFFERENTIAL/PLATELET
Basophils Absolute: 97 cells/uL (ref 0–200)
Basophils Relative: 1 %
Eosinophils Absolute: 194 cells/uL (ref 15–500)
Eosinophils Relative: 2 %
HCT: 42.9 % (ref 35.0–45.0)
Hemoglobin: 14.5 g/dL (ref 11.7–15.5)
Lymphocytes Relative: 33 %
Lymphs Abs: 3201 cells/uL (ref 850–3900)
MCH: 29.5 pg (ref 27.0–33.0)
MCHC: 33.8 g/dL (ref 32.0–36.0)
MCV: 87.4 fL (ref 80.0–100.0)
MPV: 10.8 fL (ref 7.5–12.5)
Monocytes Absolute: 873 cells/uL (ref 200–950)
Monocytes Relative: 9 %
Neutro Abs: 5335 cells/uL (ref 1500–7800)
Neutrophils Relative %: 55 %
Platelets: 292 10*3/uL (ref 140–400)
RBC: 4.91 MIL/uL (ref 3.80–5.10)
RDW: 13.4 % (ref 11.0–15.0)
WBC: 9.7 10*3/uL (ref 3.8–10.8)

## 2016-07-27 LAB — COMPREHENSIVE METABOLIC PANEL
ALT: 14 U/L (ref 6–29)
AST: 16 U/L (ref 10–35)
Albumin: 4.3 g/dL (ref 3.6–5.1)
Alkaline Phosphatase: 46 U/L (ref 33–115)
BUN: 21 mg/dL (ref 7–25)
CO2: 23 mmol/L (ref 20–31)
Calcium: 9.3 mg/dL (ref 8.6–10.2)
Chloride: 103 mmol/L (ref 98–110)
Creat: 0.87 mg/dL (ref 0.50–1.10)
Glucose, Bld: 72 mg/dL (ref 65–99)
Potassium: 4.6 mmol/L (ref 3.5–5.3)
Sodium: 137 mmol/L (ref 135–146)
Total Bilirubin: 0.4 mg/dL (ref 0.2–1.2)
Total Protein: 7.2 g/dL (ref 6.1–8.1)

## 2016-07-27 LAB — LIPID PANEL
Cholesterol: 241 mg/dL — ABNORMAL HIGH (ref 125–200)
HDL: 51 mg/dL (ref >=46)
LDL Cholesterol: 163 mg/dL — ABNORMAL HIGH (ref <130)
Total CHOL/HDL Ratio: 4.7 Ratio (ref <=5.0)
Triglycerides: 134 mg/dL (ref <150)
VLDL: 27 mg/dL (ref <30)

## 2016-07-27 LAB — TSH: TSH: 1.58 mIU/L

## 2016-07-27 MED ORDER — IBUPROFEN 600 MG PO TABS: 600.0000 mg | ORAL_TABLET | Freq: Three times a day (TID) | ORAL | 1 refills | Status: DC | PRN

## 2016-07-27 MED ORDER — FLUOXETINE HCL 10 MG PO CAPS: ORAL_CAPSULE | ORAL | 3 refills | Status: DC

## 2016-07-27 NOTE — Progress Notes (Signed)
Cathy Kirby April 16, 1966 [id number]    History:    Presents for annual exam.  Heavy monthly cycles/vasectomy. Reports cycles start with several days of dark brown and  4-5 days of heavy bleeding with clots, changing protection every 30 minutes. Cycles became heavier over this past year, impacting lifestyle. Positive ANA, arthritis. Normal Pap and mammogram history, overdue for mammogram.  Past medical history, past surgical history, family history and social history were all reviewed and documented in the EPIC chart. 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 Oldest son graduating from included accelerated nursing program, Cathy Kirby 14 both doing well. Father diabetes and lung cancer survivor.  ROS:  A ROS was performed and pertinent positives and negatives are included.  Exam:  Vitals:   07/27/16 1116  BP: 122/78  Weight: 192 lb (87.1 kg)  Height: 5\' 2"  (1.575 m)   Body mass index is 35.12 kg/m.   General appearance:  Normal Thyroid:  Symmetrical, normal in size, without palpable masses or nodularity. Respiratory  Auscultation:  Clear without wheezing or rhonchi Cardiovascular  Auscultation:  Regular rate, without rubs, murmurs or gallops  Edema/varicosities:  Not grossly evident Abdominal  Soft,nontender, without masses, guarding or rebound.  Liver/spleen:  No organomegaly noted  Hernia:  None appreciated  Skin  Inspection:  Grossly normal   Breasts: Examined lying and sitting.     Right: Without masses, retractions, discharge or axillary adenopathy.     Left: Without masses, retractions, discharge or axillary adenopathy. Gentitourinary   Inguinal/mons:  Normal without inguinal adenopathy  External genitalia:  Normal  BUS/Urethra/Skene's glands:  Normal  Vagina:  Normal  Cervix:  Normal Menses  Uterus:  normal in size, shape and contour.  Midline and mobile  Adnexa/parametria:     Rt: Without masses or tenderness.   Lt: Without masses or tenderness.  Anus and  perineum: Normal  Digital rectal exam: Normal sphincter tone without palpated masses or tenderness  Assessment/Plan:  50 y.o. M WF G2, P2 for annual exam.    Monthly  cycle/menorrhagia/vasectomy Anxiety/depression stable on Prozac  Plan: Options reviewed, sonohysterogram with Dr. Audie BoxFontaine, endometrial ablation information reviewed risks of infection, perforation, hemorrhage. Will check coverage. Motrin 600 every 8 hours with cycle. Prozac 10 mg day one through 14, 20 mg daily day 14 through 28. States has helped, will continue. Reviewed importance of exercise, calcium rich diet, vitamin D 1000 daily encouraged. SBE's, overdue for annual screening instructed to schedule. CBC, TSH, lipid panel, CMP, UA, Pap.   Harrington ChallengerYOUNG,Cathy Schwier J WHNP, 12:02 PM 07/27/2016

## 2016-07-27 NOTE — Patient Instructions (Addendum)
[place] Maintenance, Female Adopting a healthy lifestyle and getting preventive care can go a long way to promote health and wellness. Talk with your health care provider about what schedule of regular examinations is right for you. This is a good chance for you to check in with your provider about disease prevention and staying healthy. In between checkups, there are plenty of things you can do on your own. Experts have done a lot of research about which lifestyle changes and preventive measures are most likely to keep you healthy. Ask your health care provider for more information. WEIGHT AND DIET  Eat a healthy diet  Be sure to include plenty of vegetables, fruits, low-fat dairy products, and lean protein.  Do not eat a lot of foods high in solid fats, added sugars, or salt.  Get regular exercise. This is one of the most important things you can do for your health.  Most adults should exercise for at least 150 minutes each week. The exercise should increase your heart rate and make you sweat (moderate-intensity exercise).  Most adults should also do strengthening exercises at least twice a week. This is in addition to the moderate-intensity exercise.  Maintain a healthy weight  Body mass index (BMI) is a measurement that can be used to identify possible weight problems. It estimates body fat based on height and weight. Your health care provider can help determine your BMI and help you achieve or maintain a healthy weight.  For females 63 years of age and older:   A BMI below 18.5 is considered underweight.  A BMI of 18.5 to 24.9 is normal.  A BMI of 25 to 29.9 is considered overweight.  A BMI of 30 and above is considered obese.  Watch levels of cholesterol and blood lipids  You should start having your blood tested for lipids and cholesterol at 49 years of age, then have this test every 5 years.  You may need to have your cholesterol levels checked more often  if:  Your lipid or cholesterol levels are high.  You are older than 49 years of age.  You are at high risk for heart disease.  CANCER SCREENING   Lung Cancer  Lung cancer screening is recommended for adults 53-24 years old who are at high risk for lung cancer because of a history of smoking.  A yearly low-dose CT scan of the lungs is recommended for people who:  Currently smoke.  Have quit within the past 15 years.  Have at least a 30-pack-year history of smoking. A pack year is smoking an average of one pack of cigarettes a day for 1 year.  Yearly screening should continue until it has been 15 years since you quit.  Yearly screening should stop if you develop a health problem that would prevent you from having lung cancer treatment.  Breast Cancer  Practice breast self-awareness. This means understanding how your breasts normally appear and feel.  It also means doing regular breast self-exams. Let your health care provider know about any changes, no matter how small.  If you are in your 20s or 30s, you should have a clinical breast exam (CBE) by a health care provider every 1-3 years as part of a regular health exam.  If you are 56 or older, have a CBE every year. Also consider having a breast X-ray (mammogram) every year.  If you have a family history of breast cancer, talk to your health care provider about genetic screening.  If you are at high risk for breast cancer, talk to your health care provider about having an MRI and a mammogram every year.  Breast cancer gene (BRCA) assessment is recommended for women who have family members with BRCA-related cancers. BRCA-related cancers include:  Breast.  Ovarian.  Tubal.  Peritoneal cancers.  Results of the assessment will determine the need for genetic counseling and BRCA1 and BRCA2 testing. Cervical Cancer Your health care provider may recommend that you be screened regularly for cancer of the pelvic organs  (ovaries, uterus, and vagina). This screening involves a pelvic examination, including checking for microscopic changes to the surface of your cervix (Pap test). You may be encouraged to have this screening done every 3 years, beginning at age 5.  For women ages 24-65, health care providers may recommend pelvic exams and Pap testing every 3 years, or they may recommend the Pap and pelvic exam, combined with testing for human papilloma virus (HPV), every 5 years. Some types of HPV increase your risk of cervical cancer. Testing for HPV may also be done on women of any age with unclear Pap test results.  Other health care providers may not recommend any screening for nonpregnant women who are considered low risk for pelvic cancer and who do not have symptoms. Ask your health care provider if a screening pelvic exam is right for you.  If you have had past treatment for cervical cancer or a condition that could lead to cancer, you need Pap tests and screening for cancer for at least 20 years after your treatment. If Pap tests have been discontinued, your risk factors (such as having a new sexual partner) need to be reassessed to determine if screening should resume. Some women have medical problems that increase the chance of getting cervical cancer. In these cases, your health care provider may recommend more frequent screening and Pap tests. Colorectal Cancer  This type of cancer can be detected and often prevented.  Routine colorectal cancer screening usually begins at 49 years of age and continues through 49 years of age.  Your health care provider may recommend screening at an earlier age if you have risk factors for colon cancer.  Your health care provider may also recommend using home test kits to check for hidden blood in the stool.  A small camera at the end of a tube can be used to examine your colon directly (sigmoidoscopy or colonoscopy). This is done to check for the earliest forms of  colorectal cancer.  Routine screening usually begins at age 72.  Direct examination of the colon should be repeated every 5-10 years through 49 years of age. However, you may need to be screened more often if early forms of precancerous polyps or small growths are found. Skin Cancer  Check your skin from head to toe regularly.  Tell your health care provider about any new moles or changes in moles, especially if there is a change in a mole's shape or color.  Also tell your health care provider if you have a mole that is larger than the size of a pencil eraser.  Always use sunscreen. Apply sunscreen liberally and repeatedly throughout the day.  Protect yourself by wearing long sleeves, pants, a wide-brimmed hat, and sunglasses whenever you are outside. HEART DISEASE, DIABETES, AND HIGH BLOOD PRESSURE   High blood pressure causes heart disease and increases the risk of stroke. High blood pressure is more likely to develop in:  People who have blood pressure in the  high end of the normal range (130-139/85-89 mm Hg).  People who are overweight or obese.  People who are African American.  If you are 52-57 years of age, have your blood pressure checked every 3-5 years. If you are 2 years of age or older, have your blood pressure checked every year. You should have your blood pressure measured twice--once when you are at a hospital or clinic, and once when you are not at a hospital or clinic. Record the average of the two measurements. To check your blood pressure when you are not at a hospital or clinic, you can use:  An automated blood pressure machine at a pharmacy.  A home blood pressure monitor.  If you are between 68 years and 54 years old, ask your health care provider if you should take aspirin to prevent strokes.  Have regular diabetes screenings. This involves taking a blood sample to check your fasting blood sugar level.  If you are at a normal weight and have a low risk for  diabetes, have this test once every three years after 49 years of age.  If you are overweight and have a high risk for diabetes, consider being tested at a younger age or more often. PREVENTING INFECTION  Hepatitis B  If you have a higher risk for hepatitis B, you should be screened for this virus. You are considered at high risk for hepatitis B if:  You were born in a country where hepatitis B is common. Ask your health care provider which countries are considered high risk.  Your parents were born in a high-risk country, and you have not been immunized against hepatitis B (hepatitis B vaccine).  You have HIV or AIDS.  You use needles to inject street drugs.  You live with someone who has hepatitis B.  You have had sex with someone who has hepatitis B.  You get hemodialysis treatment.  You take certain medicines for conditions, including cancer, organ transplantation, and autoimmune conditions. Hepatitis C  Blood testing is recommended for:  Everyone born from [birth year] through 1964.  Anyone with known risk factors for hepatitis C. Sexually transmitted infections (STIs)  You should be screened for sexually transmitted infections (STIs) including gonorrhea and chlamydia if:  You are sexually active and are younger than 50 years of age.  You are older than 49 years of age and your health care provider tells you that you are at risk for this type of infection.  Your sexual activity has changed since you were last screened and you are at an increased risk for chlamydia or gonorrhea. Ask your health care provider if you are at risk.  If you do not have HIV, but are at risk, it may be recommended that you take a prescription medicine daily to prevent HIV infection. This is called pre-exposure prophylaxis (PrEP). You are considered at risk if:  You are sexually active and do not regularly use condoms or know the HIV status of your partner(s).  You take drugs by injection.  You are  sexually active with a partner who has HIV. Talk with your health care provider about whether you are at high risk of being infected with HIV. If you choose to begin PrEP, you should first be tested for HIV. You should then be tested every 3 months for as long as you are taking PrEP.  PREGNANCY   If you are premenopausal and you may become pregnant, ask your health care provider about preconception counseling.  If  you may become pregnant, take 400 to 800 micrograms (mcg) of folic acid every day.  If you want to prevent pregnancy, talk to your health care provider about birth control (contraception). OSTEOPOROSIS AND MENOPAUSE   Osteoporosis is a disease in which the bones lose minerals and strength with aging. This can result in serious bone fractures. Your risk for osteoporosis can be identified using a bone density scan.  If you are 30 years of age or older, or if you are at risk for osteoporosis and fractures, ask your health care provider if you should be screened.  Ask your health care provider whether you should take a calcium or vitamin D supplement to lower your risk for osteoporosis.  Menopause may have certain physical symptoms and risks.  Hormone replacement therapy may reduce some of these symptoms and risks. Talk to your health care provider about whether hormone replacement therapy is right for you.  HOME CARE INSTRUCTIONS   Schedule regular health, dental, and eye exams.  Stay current with your immunizations.   Do not use any tobacco products including cigarettes, chewing tobacco, or electronic cigarettes.  If you are pregnant, do not drink alcohol.  If you are breastfeeding, limit how much and how often you drink alcohol.  Limit alcohol intake to no more than 1 drink per day for nonpregnant women. One drink equals 12 ounces of beer, 5 ounces of wine, or 1 ounces of hard liquor.  Do not use street drugs.  Do not share needles.  Ask your health care provider for  help if you need support or information about quitting drugs.  Tell your health care provider if you often feel depressed.  Tell your health care provider if you have ever been abused or do not feel safe at home.   This information is not intended to replace advice given to you by your health care provider. Make sure you discuss any questions you have with your health care provider.   Document Released: 04/02/2011 Document Revised: 10/08/2014 Document Reviewed: 08/19/2013 Elsevier Interactive Patient Education 2016 Elsevier Inc. Endometrial Ablation Endometrial ablation removes the lining of the uterus (endometrium). It is usually a same-day, outpatient treatment. Ablation helps avoid major surgery, such as surgery to remove the cervix and uterus (hysterectomy). After endometrial ablation, you will have little or no menstrual bleeding and may not be able to have children. However, if you are premenopausal, you will need to use a reliable method of birth control following the procedure because of the small chance that pregnancy can occur. There are different reasons to have this procedure. These reasons include:  Heavy periods.  Bleeding that is causing anemia.  Irregular bleeding.  Bleeding fibroids on the lining inside the uterus if they are smaller than 3 centimeters. This procedure may not be possible for you if:   You want to have children in the future.   You have severe cramps with your menstrual period.   You have precancerous or cancerous cells in your uterus.   You were recently pregnant.   You have gone through menopause.   You have had major surgery on your uterus, resulting in thinning of the uterine wall. Surgeries may include:  The removal of one or more uterine fibroids (myomectomy).  A cesarean section with a classic (vertical) incision on your uterus. Ask your health care provider what type of cesarean you had. Sometimes the scar on your skin is different  than the scar on your uterus. Even if you have had  surgery on your uterus, certain types of ablation may still be safe for you. Talk with your health care provider. LET [facility] CARE PROVIDER KNOW ABOUT:  Any allergies you have.  All medicines you are taking, including vitamins, herbs, eye drops, creams, and over-the-counter medicines.  Previous problems you or members of your family have had with the use of anesthetics.  Any blood disorders you have.  Previous surgeries you have had.  Medical conditions you have. RISKS AND COMPLICATIONS  Generally, this is a safe procedure. However, as with any procedure, complications can occur. Possible complications include:  Perforation of the uterus.  Bleeding.  Infection of the uterus, bladder, or vagina.  Injury to surrounding organs.  An air bubble to the lung (air embolus).  Pregnancy following the procedure.  Failure of the procedure to help the problem, requiring hysterectomy.  Decreased ability to diagnose cancer in the lining of the uterus. BEFORE THE PROCEDURE  The lining of the uterus must be tested to make sure there is no pre-cancerous or cancer cells present.  An ultrasound may be performed to look at the size of the uterus and to check for abnormalities.  Medicines may be given to thin the lining of the uterus. PROCEDURE  During the procedure, your health care provider will use a tool called a resectoscope to help see inside your uterus. There are different ways to remove the lining of your uterus.   Radiofrequency - This method uses a radiofrequency-alternating electric current to remove the lining of the uterus.  Cryotherapy - This method uses extreme cold to freeze the lining of the uterus.  Heated-Free Liquid - This method uses heated salt (saline) solution to remove the lining of the uterus.  Microwave - This method uses high-energy microwaves to heat up the lining of the uterus to remove it.  Thermal  balloon - This method involves inserting a catheter with a balloon tip into the uterus. The balloon tip is filled with heated fluid to remove the lining of the uterus. AFTER THE PROCEDURE  After your procedure, do not have sexual intercourse or insert anything into your vagina until permitted by your health care provider. After the procedure, you may experience:  Cramps.  Vaginal discharge.  Frequent urination.   This information is not intended to replace advice given to you by your health care provider. Make sure you discuss any questions you have with your health care provider.   Document Released: 07/27/2004 Document Revised: 06/08/2015 Document Reviewed: 02/18/2013 Elsevier Interactive Patient Education [employer].

## 2016-07-28 LAB — URINALYSIS W MICROSCOPIC + REFLEX CULTURE
Bacteria, UA: NONE SEEN [HPF]
Bilirubin Urine: NEGATIVE
Casts: NONE SEEN [LPF]
Crystals: NONE SEEN [HPF]
Glucose, UA: NEGATIVE
Ketones, ur: NEGATIVE
Leukocytes, UA: NEGATIVE
Nitrite: NEGATIVE
Protein, ur: NEGATIVE
RBC / HPF: NONE SEEN RBC/HPF (ref <=2)
Specific Gravity, Urine: 1.014 (ref 1.001–1.035)
Squamous Epithelial / LPF: NONE SEEN [HPF] (ref <=5)
WBC, UA: NONE SEEN WBC/HPF (ref <=5)
Yeast: NONE SEEN [HPF]
pH: 6.5 (ref 5.0–8.0)

## 2016-07-30 ENCOUNTER — Other Ambulatory Visit: Payer: Self-pay | Admitting: *Deleted

## 2016-07-30 DIAGNOSIS — E7800 Pure hypercholesterolemia, unspecified: Secondary | ICD-10-CM

## 2016-08-03 ENCOUNTER — Telehealth: Payer: Self-pay | Admitting: *Deleted

## 2016-08-03 NOTE — Telephone Encounter (Signed)
Per [place] call ref #[id number] [facility] [phone], [phone], [phone] and [phone] covered 100% when performed in Dr's office no OV billed.  Pt will be called to schedule KW CMA

## 2016-08-06 ENCOUNTER — Other Ambulatory Visit: Payer: Self-pay | Admitting: Gynecology

## 2016-08-06 DIAGNOSIS — N939 Abnormal uterine and vaginal bleeding, unspecified: Secondary | ICD-10-CM

## 2016-08-08 ENCOUNTER — Other Ambulatory Visit: Payer: BC Managed Care – PPO

## 2016-08-08 DIAGNOSIS — E7800 Pure hypercholesterolemia, unspecified: Secondary | ICD-10-CM

## 2016-08-08 LAB — LIPID PANEL
Cholesterol: 238 mg/dL — ABNORMAL HIGH (ref <200)
HDL: 58 mg/dL (ref >50)
LDL Cholesterol: 161 mg/dL — ABNORMAL HIGH
Total CHOL/HDL Ratio: 4.1 Ratio (ref <5.0)
Triglycerides: 93 mg/dL (ref <150)
VLDL: 19 mg/dL (ref <30)

## 2016-08-15 ENCOUNTER — Other Ambulatory Visit: Payer: Self-pay | Admitting: Gynecology

## 2016-08-15 ENCOUNTER — Encounter: Payer: Self-pay | Admitting: Gynecology

## 2016-08-15 ENCOUNTER — Ambulatory Visit (INDEPENDENT_AMBULATORY_CARE_PROVIDER_SITE_OTHER): Payer: BC Managed Care – PPO

## 2016-08-15 ENCOUNTER — Ambulatory Visit (INDEPENDENT_AMBULATORY_CARE_PROVIDER_SITE_OTHER): Payer: BC Managed Care – PPO | Admitting: Gynecology

## 2016-08-15 VITALS — BP 122/78

## 2016-08-15 DIAGNOSIS — N920 Excessive and frequent menstruation with regular cycle: Secondary | ICD-10-CM

## 2016-08-15 DIAGNOSIS — N831 Corpus luteum cyst of ovary, unspecified side: Secondary | ICD-10-CM

## 2016-08-15 DIAGNOSIS — N939 Abnormal uterine and vaginal bleeding, unspecified: Secondary | ICD-10-CM

## 2016-08-15 DIAGNOSIS — N840 Polyp of corpus uteri: Secondary | ICD-10-CM

## 2016-08-15 NOTE — Patient Instructions (Signed)
Office will call you to arrange for the Hysteroscopy, D&C with Resection of the endometrial polyps

## 2016-08-15 NOTE — Progress Notes (Signed)
    Cathy Kirby 1965-11-03 [id number]        50 y.o.  [id number] presents for sonohysterogram.  Aram CandelaSaw Cathy Kirby for her annual exam with a history of accelerating heavy menses starting 6 months ago for now she will bleed for 4-5 days with clots, changing protection every 30 minutes, bleedthrough episodes. No history of same before. Menses otherwise regular. CBC with hemoglobin of 14 and normal TSH. Vasectomy birth control.  Past medical history,surgical history, problem list, medications, allergies, family history and social history were all reviewed and documented in the EPIC chart.  Directed ROS with pertinent positives and negatives documented in the history of present illness/assessment and plan.  Exam: Cathy Kirby assistant  General appearance:  Normal Abdomen soft nontender without masses guarding rebound Pelvic external BUS vagina normal. Cervix normal. Uterus normal size midline mobile nontender. Adnexa without masses or tenderness.  Ultrasound transvaginal and transabdominal shows uterus normal size with small myoma 14 x 11 mm. Endometrial echo prominent at 19.8 mm. Right ovary with physiologic changes. Left adnexa negative consistent with past history of LSO. Cul-de-sac negative.  Sonohysterogram performed, sterile technique, single-tooth tenaculum anterior lip stabilization required for catheter placement, good distention with multiple defects noted 9 x 6 mm, 15 x 9 mm, 12 x 18 mm, 12 x 19 mm. Endometrial sample taken. Patient tolerated well.   Assessment/Plan:  50 y.o. [id number] with accelerating menorrhagia and sonohysterogram consistent with multiple endometrial polyps. Reviewed situation with the patient and my recommendation to proceed with hysteroscopy D&C with resection of the endometrial polyps. I reviewed the proposed surgery with the patient to include the intraoperative and postoperative courses as well as the recovery period. Instrumentation to include the hysteroscope  resectoscope and D&C portion of the procedure were reviewed. Risks to include infection, prolonged antibiotics, hemorrhage necessitating transfusion and the risks of transfusion, inadvertent injury to internal organs including vagina, cervix, uterus and internal organs with perforation such as bowel bladder ureters vessels and nerves necessitating major exploratory reparative surgeries and future reparative surgeries including bowel resection, ostomy repair, bladder and ureteral damage repair was all discussed with her. Distended media absorption leading to metabolic complications such as fluid overload coma and seizures reviewed. No guarantees that this will relieve her bleeding as well as no guarantees as far as benign pathology despite benign endometrial biopsy with the sonohysterogram discussed. Patient is in agreement to proceed with surgery and will schedule this at her convenience.   [name],Cathy Kirby P MD, 12:45 PM 08/15/2016

## 2016-08-16 ENCOUNTER — Telehealth: Payer: Self-pay

## 2016-08-16 NOTE — Telephone Encounter (Signed)
I called and spoke with patient about scheduling outpatient surgery and ins benefits. Since she has not met any deductible or co-ins this year and plan starts over 10/01/16 she thinks she would prefer to wait and schedule surgery in Jan. I will call her as soon as I get the Jan calendar.

## 2016-09-04 ENCOUNTER — Telehealth: Payer: Self-pay

## 2016-09-04 NOTE — Telephone Encounter (Signed)
Left message that January surgery schedule is ready. Please call me to disuss date.

## 2016-09-12 ENCOUNTER — Telehealth: Payer: Self-pay

## 2016-09-12 NOTE — Telephone Encounter (Signed)
Patient called with a couple of questions about surgery.   #1 "Is this surgery something I have to do or is it okay not to do it?"  I explained to patient that she has endometrial polyp and the only way to know 100% that it is benign is to remove it.   #2  Patient asked if this will guarantee that she will have no more abnormal bleeding. I told her that impossible to guarantee and that the hope is that removing the polyp will take care of the aub but again cannot guarantee it.  Patient does want to proceed with scheduling and we reviewed her ins benefits and estimated surgery prepymt to [facility].  [facility] as she heard it is more cost efficient for her. Dr. Velvet BatheF said he is fine with this.  We picked a date and she needs to check with husband about date and finances. I went ahead and scheduled to hold time but will hold off on anything more until I hear back from her confirming the date.

## 2016-09-13 ENCOUNTER — Telehealth: Payer: Self-pay

## 2016-09-13 NOTE — Telephone Encounter (Signed)
You sent me order for Hyst D&C, resection of endometrial polyp.  Patient wonders if Hysterectomy is an option for her?

## 2016-09-14 NOTE — Telephone Encounter (Signed)
My preference would be the hysteroscopy D&C. There is a big difference in surgical risk between hysteroscopy and hysterectomy. If we lighten her menses with the hysteroscopy she is approaching menopause and may transition saving doing the hysterectomy.

## 2016-09-14 NOTE — Telephone Encounter (Signed)
Left detailed message on voicemail per DPR access.  

## 2016-09-17 ENCOUNTER — Telehealth: Payer: Self-pay

## 2016-09-17 MED ORDER — MISOPROSTOL 200 MCG PO TABS: ORAL_TABLET | ORAL | 0 refills | Status: DC

## 2016-09-17 NOTE — Telephone Encounter (Signed)
Patient called to confirm that she would like to have Hysteroscopy, D&C on 10/20/15 at 7:30am [facility]. Surgery is scheduled already. She knows front desk will be in touch with her to schedule pre op consult with Dr. Velvet BatheF.  I advised her about need for Cytotec tab vaginally hs before surgery and Rx was sent. I mentioned to her the possibility of some menstrual like cramping or spotting/light bleeding overnight with using the Cytotec and that there is no worries it just means the medication is working.

## 2016-10-05 ENCOUNTER — Telehealth: Payer: Self-pay

## 2016-10-05 NOTE — Telephone Encounter (Signed)
Should not be a problem that she is on her menses

## 2016-10-05 NOTE — Telephone Encounter (Signed)
Patient is scheduled for Hyst. D&C on 10/19/16 and pre op appt with you is 10/12/16.  She questions is it a problem if she is on her period. She said last month is came around the 15th and so there is a chance she might be on it for surgery.

## 2016-10-05 NOTE — Telephone Encounter (Signed)
Patient informed. 

## 2016-10-12 ENCOUNTER — Encounter: Payer: Self-pay | Admitting: Gynecology

## 2016-10-12 ENCOUNTER — Ambulatory Visit (INDEPENDENT_AMBULATORY_CARE_PROVIDER_SITE_OTHER): Payer: BC Managed Care – PPO | Admitting: Gynecology

## 2016-10-12 VITALS — BP 134/84

## 2016-10-12 DIAGNOSIS — N840 Polyp of corpus uteri: Secondary | ICD-10-CM

## 2016-10-12 DIAGNOSIS — N920 Excessive and frequent menstruation with regular cycle: Secondary | ICD-10-CM | POA: Diagnosis not present

## 2016-10-12 NOTE — Progress Notes (Signed)
Cathy Kirby 1966-05-19 [id number]   Preoperative consult  Chief complaint: Heavy bleeding, endometrial polyp  History of present illness: 51 y.o. [id number] with 6 month history of accelerating heavy menses. Lasting 4-5 days with clots, frequent pad changes and bleedthrough episodes. CBC and TSH normal. Sonohysterogram shows multiple endometrial defects consistent with endometrial polyps. Endometrial biopsy showed secretory endometrium with suggestion of endometrial polyp. Patient is admitted for hysteroscopy D&C and resection of endometrial polyps.  Past medical history,surgical history, medications, allergies, family history and social history were all reviewed and documented in the EPIC chart.  ROS:  Was performed and pertinent positives and negatives are included in the history of present illness.  Exam:  Kennon PortelaKim Gardner assistant Vitals:   10/12/16 0934  BP: 134/84   General: well developed, well nourished female, no acute distress HEENT: normal  Lungs: clear to auscultation without wheezing, rales or rhonchi  Cardiac: regular rate without rubs, murmurs or gallops  Abdomen: soft, nontender without masses, guarding, rebound, organomegaly  Pelvic: external bus vagina: normal   Cervix: grossly normal  Uterus: normal size, midline and mobile, nontender  Adnexa: without masses or tenderness     Assessment/Plan:  [age] y.o. [id number] with accelerating menorrhagia and sonohysterogram consistent with multiple endometrial polyps for hysteroscopy D&C, resection of endometrial polyps.  I again reviewed the proposed surgery with the patient to include the expected intraoperative and postoperative courses as well as the recovery period. The use of the hysteroscope, resectoscope and the D&C portion were all discussed. The risks of surgery to include infection, prolonged antibiotics, hemorrhage necessitating transfusion and the risks of transfusion, including transfusion reaction, hepatitis, HIV,  mad cow disease and other unknown entities were all discussed understood and accepted. The risk of damage to internal organs during the procedure, either immediately recognized or delay recognized, including vagina, cervix, uterus, possible perforation causing damage to bowel, bladder, ureters, vessels and nerves necessitating major exploratory reparative surgery and future reparative surgeries including bladder repair, ureteral damage repair, bowel resection, ostomy formation was also discussed understood and accepted. The potential for distended media absorption leading to metabolic complications such as fluid overload, coma and seizures was also discussed understood and accepted. She understands there are no guarantees that this will relieve her menorrhagia and her periods may continue heavy or get worse. The patient's questions were answered to her satisfaction and she is ready to proceed with surgery.     [name],Chaniya Genter P MD, 10:02 AM 10/12/2016

## 2016-10-12 NOTE — H&P (Signed)
Cathy Kirby 1965-12-27 [id number]   History and Physical  Chief complaint: Heavy bleeding, endometrial polyp  History of present illness: 51 y.o. [id number] with 6 month history of accelerating heavy menses. Lasting 4-5 days with clots, frequent pad changes and bleedthrough episodes. CBC and TSH normal. Sonohysterogram shows multiple endometrial defects consistent with endometrial polyps. Endometrial biopsy showed secretory endometrium with suggestion of endometrial polyp. Patient is admitted for hysteroscopy D&C and resection of endometrial polyps.  Past medical history,surgical history, medications, allergies, family history and social history were all reviewed and documented in the EPIC chart.  ROS:  Was performed and pertinent positives and negatives are included in the history of present illness.  Exam:  Kennon PortelaKim Kirby assistant Vitals:   10/12/16 0934  BP: 134/84   General: well developed, well nourished female, no acute distress HEENT: normal  Lungs: clear to auscultation without wheezing, rales or rhonchi  Cardiac: regular rate without rubs, murmurs or gallops  Abdomen: soft, nontender without masses, guarding, rebound, organomegaly  Pelvic: external bus vagina: normal   Cervix: grossly normal  Uterus: normal size, midline and mobile, nontender  Adnexa: without masses or tenderness     Assessment/Plan:  51 y.o. [id number] with accelerating menorrhagia and sonohysterogram consistent with multiple endometrial polyps for hysteroscopy D&C, resection of endometrial polyps.  I again reviewed the proposed surgery with the patient to include the expected intraoperative and postoperative courses as well as the recovery period. The use of the hysteroscope, resectoscope and the D&C portion were all discussed. The risks of surgery to include infection, prolonged antibiotics, hemorrhage necessitating transfusion and the risks of transfusion, including transfusion reaction, hepatitis, HIV,  mad cow disease and other unknown entities were all discussed understood and accepted. The risk of damage to internal organs during the procedure, either immediately recognized or delay recognized, including vagina, cervix, uterus, possible perforation causing damage to bowel, bladder, ureters, vessels and nerves necessitating major exploratory reparative surgery and future reparative surgeries including bladder repair, ureteral damage repair, bowel resection, ostomy formation was also discussed understood and accepted. The potential for distended media absorption leading to metabolic complications such as fluid overload, coma and seizures was also discussed understood and accepted. She understands there are no guarantees that this will relieve her menorrhagia and her periods may continue heavy or get worse. The patient's questions were answered to her satisfaction and she is ready to proceed with surgery.    Cathy Kirby,Cathy Kirby P MD, 10:06 AM 10/12/2016

## 2016-10-12 NOTE — Patient Instructions (Signed)
Followup for surgery as scheduled. 

## 2016-10-17 ENCOUNTER — Encounter (HOSPITAL_BASED_OUTPATIENT_CLINIC_OR_DEPARTMENT_OTHER): Payer: Self-pay | Admitting: *Deleted

## 2016-10-17 NOTE — Progress Notes (Signed)
NPO AFTER MN.  ARRIVE AT [id number].  NEEDS URINE PREG.  CALLED AND LM VIA PHONE FOR KATHY, OR SCHEDULER FOR DR FONTAINE, IF POSSBILE TO DO ISTAT 8 AM DOS. IF NOT PT WILL NEEDS CBC AND CMET AM DOS .  WILL TAKE PROZAC AM DOS W/ SIPS OF WATER.

## 2016-10-19 ENCOUNTER — Encounter (HOSPITAL_BASED_OUTPATIENT_CLINIC_OR_DEPARTMENT_OTHER): Payer: Self-pay | Admitting: *Deleted

## 2016-10-19 ENCOUNTER — Ambulatory Visit (HOSPITAL_BASED_OUTPATIENT_CLINIC_OR_DEPARTMENT_OTHER): Payer: BC Managed Care – PPO | Admitting: Anesthesiology

## 2016-10-19 ENCOUNTER — Encounter (HOSPITAL_BASED_OUTPATIENT_CLINIC_OR_DEPARTMENT_OTHER)
Admission: RE | Disposition: A | Discharge status: Home / Self Care | Payer: Self-pay | Source: Ambulatory Visit | Attending: Gynecology

## 2016-10-19 ENCOUNTER — Ambulatory Visit (HOSPITAL_BASED_OUTPATIENT_CLINIC_OR_DEPARTMENT_OTHER)
Admission: RE | Admit: 2016-10-19 | Discharge: 2016-10-19 | Disposition: A | Discharge status: Home / Self Care | Payer: BC Managed Care – PPO | Source: Ambulatory Visit | Attending: Gynecology | Admitting: Gynecology

## 2016-10-19 DIAGNOSIS — N920 Excessive and frequent menstruation with regular cycle: Secondary | ICD-10-CM

## 2016-10-19 DIAGNOSIS — N840 Polyp of corpus uteri: Secondary | ICD-10-CM | POA: Diagnosis not present

## 2016-10-19 HISTORY — DX: Polyp of corpus uteri: N84.0

## 2016-10-19 HISTORY — DX: Excessive and frequent menstruation with regular cycle: N92.0

## 2016-10-19 HISTORY — DX: Other specified postprocedural states: Z98.890

## 2016-10-19 HISTORY — PX: DILATATION & CURETTAGE/HYSTEROSCOPY WITH MYOSURE: SHX6511

## 2016-10-19 HISTORY — DX: Presence of spectacles and contact lenses: Z97.3

## 2016-10-19 HISTORY — DX: Personal history of other complications of pregnancy, childbirth and the puerperium: Z87.59

## 2016-10-19 HISTORY — DX: Other specified postprocedural states: R11.2

## 2016-10-19 LAB — CBC
HCT: 38.2 % (ref 36.0–46.0)
Hemoglobin: 13.3 g/dL (ref 12.0–15.0)
MCH: 28.5 pg (ref 26.0–34.0)
MCHC: 34.8 g/dL (ref 30.0–36.0)
MCV: 81.8 fL (ref 78.0–100.0)
Platelets: 250 10*3/uL (ref 150–400)
RBC: 4.67 MIL/uL (ref 3.87–5.11)
RDW: 13.5 % (ref 11.5–15.5)
WBC: 7.2 10*3/uL (ref 4.0–10.5)

## 2016-10-19 LAB — COMPREHENSIVE METABOLIC PANEL
ALT: 18 U/L (ref 14–54)
AST: 19 U/L (ref 15–41)
Albumin: 4.1 g/dL (ref 3.5–5.0)
Alkaline Phosphatase: 44 U/L (ref 38–126)
Anion gap: 7 (ref 5–15)
BUN: 19 mg/dL (ref 6–20)
CO2: 24 mmol/L (ref 22–32)
Calcium: 9 mg/dL (ref 8.9–10.3)
Chloride: 108 mmol/L (ref 101–111)
Creatinine, Ser: 0.8 mg/dL (ref 0.44–1.00)
GFR calc Af Amer: >60 mL/min (ref >60)
GFR calc non Af Amer: >60 mL/min (ref >60)
Glucose, Bld: 100 mg/dL — ABNORMAL HIGH (ref 65–99)
Potassium: 4 mmol/L (ref 3.5–5.1)
Sodium: 139 mmol/L (ref 135–145)
Total Bilirubin: 0.4 mg/dL (ref 0.3–1.2)
Total Protein: 6.6 g/dL (ref 6.5–8.1)

## 2016-10-19 LAB — POCT PREGNANCY, URINE: Preg Test, Ur: NEGATIVE

## 2016-10-19 SURGERY — DILATATION & CURETTAGE/HYSTEROSCOPY WITH MYOSURE
Anesthesia: General

## 2016-10-19 MED ORDER — DEXTROSE 5 % IV SOLN
2.0000 g | INTRAVENOUS | Status: AC
  Administered 2016-10-19: 2 g via INTRAVENOUS
  Filled 2016-10-19: qty 2

## 2016-10-19 MED ORDER — DEXAMETHASONE SODIUM PHOSPHATE 4 MG/ML IJ SOLN
INTRAMUSCULAR | Status: DC | PRN
  Administered 2016-10-19: 10 mg via INTRAVENOUS

## 2016-10-19 MED ORDER — FENTANYL CITRATE (PF) 100 MCG/2ML IJ SOLN
INTRAMUSCULAR | Status: DC | PRN
  Administered 2016-10-19 (×2): 50 ug via INTRAVENOUS

## 2016-10-19 MED ORDER — LIDOCAINE 2% (20 MG/ML) 5 ML SYRINGE
INTRAMUSCULAR | Status: DC | PRN
  Administered 2016-10-19: 60 mg via INTRAVENOUS

## 2016-10-19 MED ORDER — ARTIFICIAL TEARS OP OINT
TOPICAL_OINTMENT | OPHTHALMIC | Status: AC
  Filled 2016-10-19: qty 3.5

## 2016-10-19 MED ORDER — SCOPOLAMINE 1 MG/3DAYS TD PT72
1.0000 | MEDICATED_PATCH | TRANSDERMAL | Status: DC
  Administered 2016-10-19: 1.5 mg via TRANSDERMAL
  Filled 2016-10-19: qty 1

## 2016-10-19 MED ORDER — MIDAZOLAM HCL 2 MG/2ML IJ SOLN
INTRAMUSCULAR | Status: AC
  Filled 2016-10-19: qty 2

## 2016-10-19 MED ORDER — KETOROLAC TROMETHAMINE 30 MG/ML IJ SOLN
INTRAMUSCULAR | Status: DC | PRN
  Administered 2016-10-19: 30 mg via INTRAVENOUS

## 2016-10-19 MED ORDER — OXYCODONE-ACETAMINOPHEN 5-325 MG PO TABS: 1.0000 | ORAL_TABLET | ORAL | 0 refills | Status: DC | PRN

## 2016-10-19 MED ORDER — FENTANYL CITRATE (PF) 100 MCG/2ML IJ SOLN
INTRAMUSCULAR | Status: AC
  Filled 2016-10-19: qty 2

## 2016-10-19 MED ORDER — ONDANSETRON HCL 4 MG/2ML IJ SOLN
INTRAMUSCULAR | Status: DC | PRN
  Administered 2016-10-19: 4 mg via INTRAVENOUS

## 2016-10-19 MED ORDER — LACTATED RINGERS IV SOLN
INTRAVENOUS | Status: DC
  Administered 2016-10-19 (×2): via INTRAVENOUS
  Filled 2016-10-19: qty 1000

## 2016-10-19 MED ORDER — MIDAZOLAM HCL 5 MG/5ML IJ SOLN
INTRAMUSCULAR | Status: DC | PRN
  Administered 2016-10-19: 2 mg via INTRAVENOUS

## 2016-10-19 MED ORDER — ONDANSETRON HCL 4 MG/2ML IJ SOLN
INTRAMUSCULAR | Status: AC
  Filled 2016-10-19: qty 2

## 2016-10-19 MED ORDER — LIDOCAINE 2% (20 MG/ML) 5 ML SYRINGE
INTRAMUSCULAR | Status: AC
  Filled 2016-10-19: qty 5

## 2016-10-19 MED ORDER — SCOPOLAMINE 1 MG/3DAYS TD PT72
MEDICATED_PATCH | TRANSDERMAL | Status: AC
  Filled 2016-10-19: qty 1

## 2016-10-19 MED ORDER — DEXAMETHASONE SODIUM PHOSPHATE 10 MG/ML IJ SOLN
INTRAMUSCULAR | Status: AC
  Filled 2016-10-19: qty 1

## 2016-10-19 MED ORDER — CEFOTETAN DISODIUM-DEXTROSE 2-2.08 GM-% IV SOLR
INTRAVENOUS | Status: AC
  Filled 2016-10-19: qty 50

## 2016-10-19 MED ORDER — PROPOFOL 10 MG/ML IV BOLUS
INTRAVENOUS | Status: DC | PRN
  Administered 2016-10-19: 170 mg via INTRAVENOUS

## 2016-10-19 MED ORDER — FENTANYL CITRATE (PF) 100 MCG/2ML IJ SOLN
25.0000 ug | INTRAMUSCULAR | Status: DC | PRN
  Administered 2016-10-19 (×2): 25 ug via INTRAVENOUS
  Filled 2016-10-19: qty 1

## 2016-10-19 MED ORDER — PROMETHAZINE HCL 25 MG/ML IJ SOLN
6.2500 mg | INTRAMUSCULAR | Status: DC | PRN
  Filled 2016-10-19: qty 1

## 2016-10-19 SURGICAL SUPPLY — 34 items
BAG DECANTER FOR FLEXI CONT (MISCELLANEOUS) IMPLANT
CANISTER SUCTION 2500CC (MISCELLANEOUS) ×2 IMPLANT
CATH ROBINSON RED A/P 16FR (CATHETERS) ×2 IMPLANT
COVER BACK TABLE 60X90IN (DRAPES) ×2 IMPLANT
DEVICE MYOSURE LITE (MISCELLANEOUS) ×1 IMPLANT
DEVICE MYOSURE REACH (MISCELLANEOUS) IMPLANT
DILATOR CANAL MILEX (MISCELLANEOUS) IMPLANT
DRAPE HYSTEROSCOPY (DRAPE) ×2 IMPLANT
DRAPE LG THREE QUARTER DISP (DRAPES) ×2 IMPLANT
DRSG TELFA 3X8 NADH (GAUZE/BANDAGES/DRESSINGS) ×2 IMPLANT
ELECT REM PT RETURN 9FT ADLT (ELECTROSURGICAL)
ELECTRODE REM PT RTRN 9FT ADLT (ELECTROSURGICAL) IMPLANT
FILTER ARTHROSCOPY CONVERTOR (FILTER) ×2 IMPLANT
GLOVE BIO SURGEON STRL SZ7.5 (GLOVE) ×4 IMPLANT
GOWN STRL REUS W/TWL LRG LVL3 (GOWN DISPOSABLE) ×2 IMPLANT
JUMPSUIT BLUE BOOT COVER DISP (PROTECTIVE WEAR) IMPLANT
KIT ROOM TURNOVER WOR (KITS) ×2 IMPLANT
LEGGING LITHOTOMY PAIR STRL (DRAPES) ×2 IMPLANT
NDL SAFETY ECLIPSE 18X1.5 (NEEDLE) IMPLANT
NDL SPNL 22GX3.5 QUINCKE BK (NEEDLE) ×1 IMPLANT
NEEDLE HYPO 18GX1.5 SHARP (NEEDLE)
NEEDLE SPNL 22GX3.5 QUINCKE BK (NEEDLE) ×2 IMPLANT
PACK BASIN DAY SURGERY FS (CUSTOM PROCEDURE TRAY) ×2 IMPLANT
PAD DRESSING TELFA 3X8 NADH (GAUZE/BANDAGES/DRESSINGS) ×1 IMPLANT
PAD OB MATERNITY 4.3X12.25 (PERSONAL CARE ITEMS) ×2 IMPLANT
PAD PREP 24X48 CUFFED NSTRL (MISCELLANEOUS) ×2 IMPLANT
SEAL ROD LENS SCOPE MYOSURE (ABLATOR) ×2 IMPLANT
SYR CONTROL 10ML LL (SYRINGE) ×2 IMPLANT
SYRINGE LUER LOK 1CC (MISCELLANEOUS) IMPLANT
TOWEL OR 17X24 6PK STRL BLUE (TOWEL DISPOSABLE) ×4 IMPLANT
TRAY DSU PREP LF (CUSTOM PROCEDURE TRAY) ×2 IMPLANT
TUBING AQUILEX INFLOW (TUBING) ×2 IMPLANT
TUBING AQUILEX OUTFLOW (TUBING) ×2 IMPLANT
WATER STERILE IRR 500ML POUR (IV SOLUTION) ×2 IMPLANT

## 2016-10-19 NOTE — Anesthesia Postprocedure Evaluation (Addendum)
Anesthesia Post Note  Patient: Magnolia L Motta-Payne  Procedure(s) Performed: Procedure(s) (LRB): DILATATION & CURETTAGE/HYSTEROSCOPY WITH MYOSURE (N/A)  Patient location during evaluation: PACU Anesthesia Type: General Level of consciousness: awake and alert Pain management: pain level controlled Vital Signs Assessment: post-procedure vital signs reviewed and stable Respiratory status: spontaneous breathing, nonlabored ventilation, respiratory function stable and patient connected to nasal cannula oxygen Cardiovascular status: blood pressure returned to baseline and stable Postop Assessment: no signs of nausea or vomiting Anesthetic complications: no       Last Vitals:  Vitals:   10/19/16 0555 10/19/16 0801  BP: 108/70 126/82  Pulse: 86 83  Resp: 16 12  Temp: 36.9 C 36.5 C    Last Pain:  Vitals:   10/19/16 0555  TempSrc: Oral                 Miski Feldp[address]ch S

## 2016-10-19 NOTE — H&P (Signed)
The patient was examined.  I reviewed the proposed surgery and consent form with the patient.  The dictated history and physical is current and accurate and all questions were answered. The patient is ready to proceed with surgery and has a realistic understanding and expectation for the outcome.   [name],Kaprice Kage P MD, 7:09 AM 10/19/2016

## 2016-10-19 NOTE — Discharge Instructions (Signed)
° °  Postoperative Instructions Hysteroscopy D & C ° °Dr. Fontaine and the nursing staff have discussed postoperative instructions with you.  If you have any questions please ask them before you leave the hospital, or call Dr Fontaine’s office at [phone].   ° °We would like to emphasize the following instructions: ° ° °? Call the office to make your follow-up appointment as recommended by Dr Fontaine (usually 1-2 weeks). ° °? You were given a prescription, or one was ordered for you at the pharmacy you designated.  Get that prescription filled and take the medication according to instructions. ° °? You may eat a regular diet, but slowly until you start having bowel movements. ° °? Drink plenty of water daily. ° °? Nothing in the vagina (intercourse, douching, objects of any kind) for two weeks.  When reinitiating intercourse, if it is uncomfortable, stop and make an appointment with Dr Fontaine to be evaluated. ° °? No driving for one to two days until the effects of anesthesia has worn off.  No traveling out of town for several days. ° °? You may shower, but no baths for one week.  Walking up and down stairs is ok.  No heavy lifting, prolonged standing, repeated bending or any “working out” until your post op check. ° °? Rest frequently, listen to your body and do not push yourself and overdo it. ° °? Call if: ° °o Your pain medication does not seem strong enough. °o Worsening pain or abdominal bloating °o Persistent nausea or vomiting °o Difficulty with urination or bowel movements. °o Temperature of 101 degrees or higher. °o Heavy vaginal bleeding.  If your period is due, you may use tampons. °o You have any questions or concerns ° ° ° °Post Anesthesia Home Care Instructions ° °Activity: °Get plenty of rest for the remainder of the day. A responsible adult should stay with you for 24 hours following the procedure.  °For the next 24 hours, DO NOT: °-Drive a car °-Operate machinery °-Drink alcoholic  beverages °-Take any medication unless instructed by your physician °-Make any legal decisions or sign important papers. ° °Meals: °Start with liquid foods such as gelatin or soup. Progress to regular foods as tolerated. Avoid greasy, spicy, heavy foods. If nausea and/or vomiting occur, drink only clear liquids until the nausea and/or vomiting subsides. Call your physician if vomiting continues. ° °Special Instructions/Symptoms: °Your throat may feel dry or sore from the anesthesia or the breathing tube placed in your throat during surgery. If this causes discomfort, gargle with warm salt water. The discomfort should disappear within 24 hours. ° °If you had a scopolamine patch placed behind your ear for the management of post- operative nausea and/or vomiting: ° °1. The medication in the patch is effective for 72 hours, after which it should be removed.  Wrap patch in a tissue and discard in the trash. Wash hands thoroughly with soap and water. °2. You may remove the patch earlier than 72 hours if you experience unpleasant side effects which may include dry mouth, dizziness or visual disturbances. °3. Avoid touching the patch. Wash your hands with soap and water after contact with the patch. °  ° °

## 2016-10-19 NOTE — Op Note (Signed)
Cathy Kirby 07/07/1966 [id number]   Post Operative Note   Date of surgery:  10/19/2016  Pre Op Dx:  Menorrhagia, endometrial polyps  Post Op Dx:  Menorrhagia, endometrial polyps  Procedure:  Hysteroscopy, D&C, Myosure resection endometrial polyps  Surgeon:  Colin BroachFONTAINE,Timara Loma Kirby  Anesthesia:  General  EBL:  Minimal  Distended media discrepancy:  115 cc saline  Complications:  None  Specimen:  #1 endometrial polyps #2 endometrial curettings to pathology  Findings: EUA:  External BUS vagina normal. Cervix normal. Uterus normal size midline, mobile. Adnexa without masses   Hysteroscopy:  Adequate noting fundus, right/left tubal ostia, anterior/posterior endometrial surfaces, lower uterine segment, endocervical canal all visualized. Multiple small endometrial polyps noted throughout the endometrial cavity. All visible polyps were resected to the level the surrounding endometrium.  Procedure:  The patient was taken to the operating room, was placed in the low dorsal lithotomy position, underwent general anesthesia, received a perineal/vaginal preparation with Betadine solution and the bladder was emptied with in and out Foley catheterization. The timeout was performed by the surgical team. An EUA was performed. The patient was draped in the usual fashion. The cervix was visualized with a speculum, anterior lip grasped with a single-tooth tenaculum and a paracervical block was placed using 10 cc's of 1% lidocaine. The cervix was gently dilated to admit the Myosure hysteroscope and hysteroscopy was performed with findings noted above. Using the Myosure Lite resectoscopic wand the polyps were resected in their entirety to the level the surrounding endometrium. A gentle sharp curettage was performed. Both specimens were sent separately to pathology.  Repeat hysteroscopy showed an empty cavity with good distention and no evidence of perforation. The instruments were removed and adequate  hemostasis was visualized at the tenaculum site and external cervical os. The patient was given intraoperative Toradol, was awakened without difficulty and was taken to the recovery room in good condition having tolerated the procedure well.    [name],Cathy Feeley P MD, 7:54 AM 10/19/2016

## 2016-10-19 NOTE — Anesthesia Preprocedure Evaluation (Signed)
Anesthesia Evaluation  Patient identified by MRN, date of birth, ID band Patient awake    Reviewed: Allergy & Precautions, NPO status , Patient's Chart, lab work & pertinent test results  Airway Mallampati: II  TM Distance: >3 FB Neck ROM: Full    Dental no notable dental hx.    Pulmonary neg pulmonary ROS,    Pulmonary exam normal breath sounds clear to auscultation       Cardiovascular negative cardio ROS Normal cardiovascular exam Rhythm:Regular Rate:Normal     Neuro/Psych negative neurological ROS  negative psych ROS   GI/Hepatic negative GI ROS, Neg liver ROS,   Endo/Other  negative endocrine ROS  Renal/GU negative Renal ROS  negative genitourinary   Musculoskeletal negative musculoskeletal ROS (+)   Abdominal   Peds negative pediatric ROS (+)  Hematology negative hematology ROS (+)   Anesthesia Other Findings   Reproductive/Obstetrics negative OB ROS                             Anesthesia Physical Anesthesia Plan  ASA: I  Anesthesia Plan: General   Post-op Pain Management:    Induction: Intravenous  Airway Management Planned: LMA  Additional Equipment:   Intra-op Plan:   Post-operative Plan: Extubation in OR  Informed Consent: I have reviewed the patients History and Physical, chart, labs and discussed the procedure including the risks, benefits and alternatives for the proposed anesthesia with the patient or authorized representative who has indicated his/her understanding and acceptance.   Dental advisory given  Plan Discussed with: CRNA and Surgeon  Anesthesia Plan Comments:         Anesthesia Quick Evaluation  

## 2016-10-19 NOTE — Transfer of Care (Signed)
Immediate Anesthesia Transfer of Care Note  Patient: Elenora L Motta-Payne  Procedure(s) Performed: Procedure(s) with comments: DILATATION & CURETTAGE/HYSTEROSCOPY WITH MYOSURE (N/A) - requesting 7:30am start time  Requests one hour OR time.  Patient Location: PACU  Anesthesia Type:General  Level of Consciousness: awake, alert , oriented and patient cooperative  Airway & Oxygen Therapy: Patient Spontanous Breathing and Patient connected to nasal cannula oxygen  Post-op Assessment: Report given to RN and Post -op Vital signs reviewed and stable  Post vital signs: Reviewed and stable  Last Vitals:  Vitals:   10/19/16 0555  BP: 108/70  Pulse: 86  Resp: 16  Temp: 36.9 C    Last Pain:  Vitals:   10/19/16 0555  TempSrc: Oral      Patients Stated Pain Goal: 5 (10/19/16 [id number])  Complications: No apparent anesthesia complications

## 2016-10-19 NOTE — Anesthesia Procedure Notes (Signed)
Procedure Name: LMA Insertion Date/Time: 10/19/2016 7:27 AM Performed by: Tyrone NineSAUVE, Tashawn Greff F Pre-anesthesia Checklist: Patient identified, Timeout performed, Emergency Drugs available, Suction available and Patient being monitored Patient Re-evaluated:Patient Re-evaluated prior to inductionOxygen Delivery Method: Circle system utilized Preoxygenation: Pre-oxygenation with 100% oxygen Intubation Type: IV induction Ventilation: Mask ventilation without difficulty LMA: LMA inserted LMA Size: 4.0 Number of attempts: 1 Placement Confirmation: breath sounds checked- equal and bilateral and positive ETCO2 Tube secured with: Tape Dental Injury: Teeth and Oropharynx as per pre-operative assessment

## 2016-10-22 ENCOUNTER — Encounter (HOSPITAL_BASED_OUTPATIENT_CLINIC_OR_DEPARTMENT_OTHER): Payer: Self-pay | Admitting: Gynecology

## 2016-11-06 ENCOUNTER — Ambulatory Visit: Payer: BC Managed Care – PPO | Admitting: Gynecology

## 2016-11-08 ENCOUNTER — Ambulatory Visit: Payer: BC Managed Care – PPO | Admitting: Gynecology

## 2016-11-13 ENCOUNTER — Ambulatory Visit (INDEPENDENT_AMBULATORY_CARE_PROVIDER_SITE_OTHER): Payer: BC Managed Care – PPO | Admitting: Gynecology

## 2016-11-13 ENCOUNTER — Encounter: Payer: Self-pay | Admitting: Gynecology

## 2016-11-13 VITALS — BP 120/76

## 2016-11-13 DIAGNOSIS — Z9889 Other specified postprocedural states: Secondary | ICD-10-CM

## 2016-11-13 NOTE — Patient Instructions (Signed)
Follow up routinely when you're due for annual exam. 

## 2016-11-13 NOTE — Progress Notes (Signed)
    Mosetta PuttCharese L Motta-Payne 12-Jun-1966 [id number]        51 y.o.  [id number] presents for her postoperative visit status post hysteroscopic resection of endometrial polyps. Pathology showed polypoid endometrium with no evidence of hyperplasia or atypia.  Past medical history,surgical history, problem list, medications, allergies, family history and social history were all reviewed and documented in the EPIC chart.  Directed ROS with pertinent positives and negatives documented in the history of present illness/assessment and plan.  Exam: Kennon PortelaKim Gardner assistant Vitals:   11/13/16 0921  BP: 120/76   General appearance:  Normal Abdomen soft nontender without masses guarding rebound External BUS vagina normal. Cervix normal. Uterus grossly normal size midline mobile nontender. Adnexa without masses or tenderness.  Assessment/Plan:  51 y.o. [id number] is normal postoperative visit. Patient will keep menstrual calendar. If menorrhagia continues I reviewed options to include hormonal manipulation, Mirena IUD, endometrial ablation, hysterectomy. Cervix is high in the vagina which I think would make vaginal/LAVH difficult. Possible robotic approach was reviewed with the patient. Will readdress if continues to be an issue. Follow up when patient is  due for annual exam with Harriett SineNancy,  sooner if any issues.    [name],Josie Mesa P MD, 9:42 AM 11/13/2016

## 2016-12-11 ENCOUNTER — Encounter: Payer: Self-pay | Admitting: Women's Health

## 2017-01-16 ENCOUNTER — Encounter: Payer: Self-pay | Admitting: Gynecology

## 2017-01-17 NOTE — Telephone Encounter (Signed)
Yes, she would be a candidate for endometrial ablation if she wanted to take that route

## 2017-01-18 ENCOUNTER — Telehealth: Payer: Self-pay

## 2017-01-18 NOTE — Telephone Encounter (Signed)
I had received email in My Chart from patient earlier today regarding endometrial ablation. Dr. Velvet Bathe said she was a candidate and could schedule.  I checked ins benefits and called patient to let her know it is covered by her ins with a $25 copayment then 100% and no prior autho is required. We discussed need for someone to drive her to and from.  We discussed possible dates. She wants to see about finding someone to drive her and she will call me on Monday to let me know what she decides about scheduling.

## 2017-01-22 ENCOUNTER — Telehealth: Payer: Self-pay

## 2017-01-22 NOTE — Telephone Encounter (Signed)
Patient called to schedule ablation. She anticipates menses in the next week. Will call me with menses start so I can send Rx for Prometrium to begin on Day 3 of cycle.  Patient was scheduled for 02/11/17 at 10:00am. She was instructed she will need someone to drive her to and from procedure. She will need full bladder. Instructed regarding need for Cytotec tab vaginally hs before procedure.  Rx will be sent when I send Prometrium.  Instruction sheet will be mailed. Appt desk will contact her to schedule pre op appt with Dr. Velvet Bathe.

## 2017-01-23 ENCOUNTER — Other Ambulatory Visit: Payer: Self-pay | Admitting: Gynecology

## 2017-01-23 DIAGNOSIS — N920 Excessive and frequent menstruation with regular cycle: Secondary | ICD-10-CM

## 2017-01-28 ENCOUNTER — Telehealth: Payer: Self-pay

## 2017-01-28 MED ORDER — MISOPROSTOL 200 MCG PO TABS: ORAL_TABLET | ORAL | 0 refills | Status: DC

## 2017-01-28 MED ORDER — PROGESTERONE MICRONIZED 200 MG PO CAPS: 200.0000 mg | ORAL_CAPSULE | Freq: Every day | ORAL | 0 refills | Status: DC

## 2017-01-28 NOTE — Telephone Encounter (Signed)
Patient is schedule for ablation on 02/11/17 and called me today to let me know her period began on 01/26/17 so I can call Rx's in.  I will send Rx for Prometrium and Cytotec today.

## 2017-01-28 NOTE — Telephone Encounter (Signed)
Called patient and left message that I got her message and sent Rx's. Reminded her to start Prometrium tonight and take hs.  Cytotec vaginally hs before ablation.

## 2017-02-04 ENCOUNTER — Ambulatory Visit (INDEPENDENT_AMBULATORY_CARE_PROVIDER_SITE_OTHER): Payer: BC Managed Care – PPO | Admitting: Gynecology

## 2017-02-04 ENCOUNTER — Encounter: Payer: Self-pay | Admitting: Gynecology

## 2017-02-04 VITALS — BP 118/76

## 2017-02-04 DIAGNOSIS — N920 Excessive and frequent menstruation with regular cycle: Secondary | ICD-10-CM | POA: Diagnosis not present

## 2017-02-04 MED ORDER — AZITHROMYCIN 500 MG PO TABS: ORAL_TABLET | ORAL | 0 refills | Status: DC

## 2017-02-04 MED ORDER — OXYCODONE-ACETAMINOPHEN 5-325 MG PO TABS: 1.0000 | ORAL_TABLET | ORAL | 0 refills | Status: DC | PRN

## 2017-02-04 MED ORDER — DIAZEPAM 5 MG PO TABS: 5.0000 mg | ORAL_TABLET | Freq: Four times a day (QID) | ORAL | 0 refills | Status: DC | PRN

## 2017-02-04 NOTE — Patient Instructions (Signed)
Follow up for the endometrial ablation as scheduled. 

## 2017-02-04 NOTE — Progress Notes (Signed)
    Cathy Kirby Sep 03, 1966 [id number]        51 y.o.  [id number] presents preoperatively for her upcoming [facility] endometrial ablation. History of hysteroscopy D&C with resection of polypoid endometrial tissue January 2018. Pathology was benign without evidence of hyperplasia or atypia. Her menses have continued heavy particularly for the first 3 days with frequent pad changes and bleedthrough episodes. Total length of her menses approximately 5 days. Using vasectomy birth control.    Past medical history,surgical history, problem list, medications, allergies, family history and social history were all reviewed and documented in the EPIC chart.  Directed ROS with pertinent positives and negatives documented in the history of present illness/assessment and plan.  Exam: Cathy Kirby Vitals:   02/04/17 1500  BP: 118/76   General appearance:  Normal Abdomen soft nontender without masses guarding rebound Pelvic external BUS vagina normal. Cervix normal. Uterus normal size midline mobile nontender. Adnexa without masses or tenderness   Assessment/Plan:  51 y.o. [name] with menorrhagia and recent hysteroscopy D&C with continued heavy menses. Options for management reviewed and patient elects for endometrial ablation. Uses vasectomy birth control and understands that she should never pursue pregnancy following the procedure. I reviewed the proposed procedure with her to include the intraoperative and postoperative courses. Risks to include damage to vagina cervix uterus and internal organs including bowel bladder ureters vessels and nerves either directly or through cryo-damage necessitating major reparative surgeries and future reparative surgeries including ostomy formation was reviewed. The risks of hemorrhage necessitating transfusion as well as the risks of infection requiring treatment following was also discussed. Lastly she understands her no guarantees that this will lighten her  menses and they may continue heavy following the procedure. I reviewed her medications to include Cytotec, Valium, oxycodone, azithromycin and we reviewed the timing as to when to take these medications and a written prescription was given for the Valium oxycodone and azithromycin. The Cytotec had already been called to the pharmacy. Patient's questions were answered to her satisfaction and she is ready to proceed with the surgery as scheduled. She has read through our [facility] consent form which she signed and was witnessed and this is scanned into Epic.    Cathy Kirby P MD, 3:19 PM 02/04/2017

## 2017-02-11 ENCOUNTER — Encounter: Payer: Self-pay | Admitting: Gynecology

## 2017-02-11 ENCOUNTER — Ambulatory Visit (INDEPENDENT_AMBULATORY_CARE_PROVIDER_SITE_OTHER): Payer: BC Managed Care – PPO | Admitting: Gynecology

## 2017-02-11 ENCOUNTER — Ambulatory Visit (INDEPENDENT_AMBULATORY_CARE_PROVIDER_SITE_OTHER): Payer: BC Managed Care – PPO

## 2017-02-11 VITALS — BP 118/76

## 2017-02-11 DIAGNOSIS — N920 Excessive and frequent menstruation with regular cycle: Secondary | ICD-10-CM | POA: Diagnosis not present

## 2017-02-11 DIAGNOSIS — R102 Pelvic and perineal pain: Secondary | ICD-10-CM | POA: Diagnosis not present

## 2017-02-11 MED ORDER — LIDOCAINE HCL 1 % IJ SOLN
10.0000 mL | Freq: Once | INTRAMUSCULAR | Status: AC
  Administered 2017-02-11: 10 mL

## 2017-02-11 MED ORDER — KETOROLAC TROMETHAMINE 30 MG/ML IJ SOLN
60.0000 mg | Freq: Once | INTRAMUSCULAR | Status: AC
  Administered 2017-02-11: 60 mg via INTRAMUSCULAR

## 2017-02-11 NOTE — Patient Instructions (Signed)
Follow up for your postoperative appointment as scheduled

## 2017-02-11 NOTE — Progress Notes (Signed)
HER OPTION ENDOMETRIAL ABLATION PROCEDURAL NOTE    Cathy Kirby December 20, 1965 [id number]   02/11/2017  Diagnosis:  Excessive uterine bleeding / Menorrhagia  Procedure:  Endometrial cryoablation with intraoperative ultrasonic guidance  Procedure:  The patient was brought to the treatment room having previously been counseled for the procedure and having signed the consent form that is scanned into Epic. Pre procedural medications received were oxycodone/acetaminophen 01/3250 tab, Valium 5 mg, Toradol 60 mg.  The patient was placed in the dorsal lithotomy position and a speculum was inserted. The cervix and upper vagina were cleansed with Betadine. A single-tooth tenaculum was placed on the anterior lip of the cervix. A  paracervical block was placed Yes.   using 10 cc's of 1% lidocaine.  The uterus was Yes.   sounded 9 centimeters. Cervical dilatation performed No. . Under ultrasound guidance, the Her Option probe was introduced into the uterine cavity after the preprocedural sequence was performed. After assuring proper cornual placement, cryoablation was then performed under continuous ultrasound guidance monitoring the growth of the cryo-zone. Sequential cryoablation's were performed in the following order:   Location   Length of Time Myometrial Depth 1. Right cornua   6 minutes  14 mm 2. Left cornua   6 minutes  7.2 mm   Upon completion of the procedure the instruments were removed, hemostasis visualized the patient was assisted to the bathroom and then to another exam room where she was observed. The patient tolerated the procedure well and was released in stable condition with her driver along with a copy of the postprocedural instructions and precautions which were reviewed with her. She is to return to the office in 2 weeks for post procedural check and received an appointment date and time.    [name],Deija Buhrman P MD, 11:09 AM 02/11/2017

## 2017-02-12 ENCOUNTER — Encounter: Payer: Self-pay | Admitting: Gynecology

## 2017-02-28 ENCOUNTER — Encounter: Payer: Self-pay | Admitting: Gynecology

## 2017-02-28 ENCOUNTER — Ambulatory Visit (INDEPENDENT_AMBULATORY_CARE_PROVIDER_SITE_OTHER): Payer: BC Managed Care – PPO | Admitting: Gynecology

## 2017-02-28 VITALS — BP 120/74

## 2017-02-28 DIAGNOSIS — Z09 Encounter for follow-up examination after completed treatment for conditions other than malignant neoplasm: Secondary | ICD-10-CM

## 2017-02-28 NOTE — Progress Notes (Signed)
    Cathy Kirby 06/27/1966 [id number]        51 y.o.  [id number] presents for postoperative visit status post HerOption endometrial ablation. Has done well with slight mucousy discharge now.  Past medical history,surgical history, problem list, medications, allergies, family history and social history were all reviewed and documented in the EPIC chart.  Directed ROS with pertinent positives and negatives documented in the history of present illness/assessment and plan.  Exam: Cathy HerrlichKim Gardnerassistant Vitals:   02/28/17 1525  BP: 120/74   General appearance:  Normal Abdomen soft nontender without masses guarding rebound Pelvic external BUS vagina normal. Cervix normal. Uterus normal size midline mobile nontender. Adnexa without masses or tenderness.  Assessment/Plan:  51 y.o. [name]  with normal postoperative visit status post HerOption endometrial ablation. Patient will keep menstrual calendar and will follow up in November 2018 when due for annual exam, sooner if any issues.    Cathy Kirby,Cathy Kirby P MD, 3:39 PM 02/28/2017

## 2017-02-28 NOTE — Patient Instructions (Signed)
Follow up for annual exam Nov 2018.  Sooner if any problems.

## 2017-03-04 NOTE — Addendum Note (Signed)
Addendum  created 03/04/17 1014 by Erion Hermans, MD   Sign clinical note    

## 2017-12-16 ENCOUNTER — Telehealth: Payer: Self-pay | Admitting: *Deleted

## 2017-12-16 NOTE — Telephone Encounter (Signed)
Pt called asking at the age of 52 can she still have annual visits with Maryelizabeth RowanNancy Young, I left a detailed message on cell yes to call and schedule. Also the number/name to schedule colonoscopy. I left name of [place] and [phone]

## 2018-05-08 ENCOUNTER — Other Ambulatory Visit: Payer: Self-pay | Admitting: Women's Health

## 2018-05-08 DIAGNOSIS — F4323 Adjustment disorder with mixed anxiety and depressed mood: Secondary | ICD-10-CM

## 2018-05-08 NOTE — Telephone Encounter (Signed)
Patient is very overdue for annual exam. Last one was 07/22/2016.  Is scheduled 06/17/18 for CE with you.  Quantity of Rx and directions may need to be adjusted to how patient has reported taking. (Pt reports takes 10mg  every morning and as needed she takes 2 tablets around her menses.)

## 2018-05-08 NOTE — Telephone Encounter (Signed)
ok 

## 2018-06-17 ENCOUNTER — Ambulatory Visit: Payer: BC Managed Care – PPO | Admitting: Women's Health

## 2018-06-17 ENCOUNTER — Telehealth: Payer: Self-pay | Admitting: *Deleted

## 2018-06-17 ENCOUNTER — Encounter: Payer: Self-pay | Admitting: Women's Health

## 2018-06-17 VITALS — BP 120/72 | Ht 63.0 in | Wt 201.0 lb

## 2018-06-17 DIAGNOSIS — Z13220 Encounter for screening for lipoid disorders: Secondary | ICD-10-CM

## 2018-06-17 DIAGNOSIS — Z23 Encounter for immunization: Secondary | ICD-10-CM

## 2018-06-17 DIAGNOSIS — F4323 Adjustment disorder with mixed anxiety and depressed mood: Secondary | ICD-10-CM

## 2018-06-17 DIAGNOSIS — Z01419 Encounter for gynecological examination (general) (routine) without abnormal findings: Secondary | ICD-10-CM

## 2018-06-17 LAB — CBC WITH DIFFERENTIAL/PLATELET
Basophils Absolute: 73 cells/uL (ref 0–200)
Basophils Relative: 0.9 %
Eosinophils Absolute: 227 cells/uL (ref 15–500)
Eosinophils Relative: 2.8 %
HCT: 42.8 % (ref 35.0–45.0)
Hemoglobin: 14.4 g/dL (ref 11.7–15.5)
Lymphs Abs: 3232 cells/uL (ref 850–3900)
MCH: 28.8 pg (ref 27.0–33.0)
MCHC: 33.6 g/dL (ref 32.0–36.0)
MCV: 85.6 fL (ref 80.0–100.0)
MPV: 11.5 fL (ref 7.5–12.5)
Monocytes Relative: 10.8 %
Neutro Abs: 3694 cells/uL (ref 1500–7800)
Neutrophils Relative %: 45.6 %
Platelets: 264 10*3/uL (ref 140–400)
RBC: 5 10*6/uL (ref 3.80–5.10)
RDW: 12.7 % (ref 11.0–15.0)
Total Lymphocyte: 39.9 %
WBC mixed population: 875 cells/uL (ref 200–950)
WBC: 8.1 10*3/uL (ref 3.8–10.8)

## 2018-06-17 LAB — COMPREHENSIVE METABOLIC PANEL
AG Ratio: 1.9 (calc) (ref 1.0–2.5)
ALT: 16 U/L (ref 6–29)
AST: 16 U/L (ref 10–35)
Albumin: 4.2 g/dL (ref 3.6–5.1)
Alkaline phosphatase (APISO): 47 U/L (ref 33–130)
BUN: 11 mg/dL (ref 7–25)
CO2: 25 mmol/L (ref 20–32)
Calcium: 9.3 mg/dL (ref 8.6–10.4)
Chloride: 104 mmol/L (ref 98–110)
Creat: 0.77 mg/dL (ref 0.50–1.05)
Globulin: 2.2 g/dL (calc) (ref 1.9–3.7)
Glucose, Bld: 78 mg/dL (ref 65–99)
Potassium: 4.3 mmol/L (ref 3.5–5.3)
Sodium: 138 mmol/L (ref 135–146)
Total Bilirubin: 0.5 mg/dL (ref 0.2–1.2)
Total Protein: 6.4 g/dL (ref 6.1–8.1)

## 2018-06-17 LAB — LIPID PANEL
Cholesterol: 238 mg/dL — ABNORMAL HIGH (ref <200)
HDL: 58 mg/dL (ref >50)
LDL Cholesterol (Calc): 158 mg/dL (calc) — ABNORMAL HIGH
Non-HDL Cholesterol (Calc): 180 mg/dL (calc) — ABNORMAL HIGH (ref <130)
Total CHOL/HDL Ratio: 4.1 (calc) (ref <5.0)
Triglycerides: 103 mg/dL (ref <150)

## 2018-06-17 MED ORDER — FLUOXETINE HCL 10 MG PO CAPS: ORAL_CAPSULE | ORAL | 4 refills | Status: DC

## 2018-06-17 NOTE — Telephone Encounter (Signed)
Already done

## 2018-06-17 NOTE — Patient Instructions (Signed)
Cathy Kirby, Dr Carlean Purl  [place] Maintenance, Female Adopting a healthy lifestyle and getting preventive care can go a long way to promote health and wellness. Talk with your health care provider about what schedule of regular examinations is right for you. This is a good chance for you to check in with your provider about disease prevention and staying healthy. In between checkups, there are plenty of things you can do on your own. Experts have done a lot of research about which lifestyle changes and preventive measures are most likely to keep you healthy. Ask your health care provider for more information. Weight and diet Eat a healthy diet  Be sure to include plenty of vegetables, fruits, low-fat dairy products, and lean protein.  Do not eat a lot of foods high in solid fats, added sugars, or salt.  Get regular exercise. This is one of the most important things you can do for your health. ? Most adults should exercise for at least 150 minutes each week. The exercise should increase your heart rate and make you sweat (moderate-intensity exercise). ? Most adults should also do strengthening exercises at least twice a week. This is in addition to the moderate-intensity exercise.  Maintain a healthy weight  Body mass index (BMI) is a measurement that can be used to identify possible weight problems. It estimates body fat based on height and weight. Your health care provider can help determine your BMI and help you achieve or maintain a healthy weight.  For females 69 years of age and older: ? A BMI below 18.5 is considered underweight. ? A BMI of 18.5 to 24.9 is normal. ? A BMI of 25 to 29.9 is considered overweight. ? A BMI of 30 and above is considered obese.  Watch levels of cholesterol and blood lipids  You should start having your blood tested for lipids and cholesterol at 51 years of age, then have this test every 5 years.  You may need to have your cholesterol levels  checked more often if: ? Your lipid or cholesterol levels are high. ? You are older than 51 years of age. ? You are at high risk for heart disease.  Cancer screening Lung Cancer  Lung cancer screening is recommended for adults 28-73 years old who are at high risk for lung cancer because of a history of smoking.  A yearly low-dose CT scan of the lungs is recommended for people who: ? Currently smoke. ? Have quit within the past 15 years. ? Have at least a 30-pack-year history of smoking. A pack year is smoking an average of one pack of cigarettes a day for 1 year.  Yearly screening should continue until it has been 15 years since you quit.  Yearly screening should stop if you develop a health problem that would prevent you from having lung cancer treatment.  Breast Cancer  Practice breast self-awareness. This means understanding how your breasts normally appear and feel.  It also means doing regular breast self-exams. Let your health care provider know about any changes, no matter how small.  If you are in your 20s or 30s, you should have a clinical breast exam (CBE) by a health care provider every 1-3 years as part of a regular health exam.  If you are 61 or older, have a CBE every year. Also consider having a breast X-ray (mammogram) every year.  If you have a family history of breast cancer, talk to your health care provider about  If you are at high risk for breast cancer, talk to your health care provider about having an MRI and a mammogram every year.  Breast cancer gene (BRCA) assessment is recommended for women who have family members with BRCA-related cancers. BRCA-related cancers include: ? Breast. ? Ovarian. ? Tubal. ? Peritoneal cancers.  Results of the assessment will determine the need for genetic counseling and BRCA1 and BRCA2 testing.  Cervical Cancer Your health care provider may recommend that you be screened regularly for cancer of the  pelvic organs (ovaries, uterus, and vagina). This screening involves a pelvic examination, including checking for microscopic changes to the surface of your cervix (Pap test). You may be encouraged to have this screening done every 3 years, beginning at age 21.  For women ages 30-65, health care providers may recommend pelvic exams and Pap testing every 3 years, or they may recommend the Pap and pelvic exam, combined with testing for human papilloma virus (HPV), every 5 years. Some types of HPV increase your risk of cervical cancer. Testing for HPV may also be done on women of any age with unclear Pap test results.  Other health care providers may not recommend any screening for nonpregnant women who are considered low risk for pelvic cancer and who do not have symptoms. Ask your health care provider if a screening pelvic exam is right for you.  If you have had past treatment for cervical cancer or a condition that could lead to cancer, you need Pap tests and screening for cancer for at least 20 years after your treatment. If Pap tests have been discontinued, your risk factors (such as having a new sexual partner) need to be reassessed to determine if screening should resume. Some women have medical problems that increase the chance of getting cervical cancer. In these cases, your health care provider may recommend more frequent screening and Pap tests.  Colorectal Cancer  This type of cancer can be detected and often prevented.  Routine colorectal cancer screening usually begins at 50 years of age and continues through 51 years of age.  Your health care provider may recommend screening at an earlier age if you have risk factors for colon cancer.  Your health care provider may also recommend using home test kits to check for hidden blood in the stool.  A small camera at the end of a tube can be used to examine your colon directly (sigmoidoscopy or colonoscopy). This is done to check for the  earliest forms of colorectal cancer.  Routine screening usually begins at age 50.  Direct examination of the colon should be repeated every 5-10 years through 51 years of age. However, you may need to be screened more often if early forms of precancerous polyps or small growths are found.  Skin Cancer  Check your skin from head to toe regularly.  Tell your health care provider about any new moles or changes in moles, especially if there is a change in a mole's shape or color.  Also tell your health care provider if you have a mole that is larger than the size of a pencil eraser.  Always use sunscreen. Apply sunscreen liberally and repeatedly throughout the day.  Protect yourself by wearing long sleeves, pants, a wide-brimmed hat, and sunglasses whenever you are outside.  Heart disease, diabetes, and high blood pressure  High blood pressure causes heart disease and increases the risk of stroke. High blood pressure is more likely to develop in: ? People who have blood   who have blood pressure in the high end of the normal range (130-139/85-89 mm Hg). ? People who are overweight or obese. ? People who are African American.  If you are 5-22 years of age, have your blood pressure checked every 3-5 years. If you are 69 years of age or older, have your blood pressure checked every year. You should have your blood pressure measured twice-once when you are at a hospital or clinic, and once when you are not at a hospital or clinic. Record the average of the two measurements. To check your blood pressure when you are not at a hospital or clinic, you can use: ? An automated blood pressure machine at a pharmacy. ? A home blood pressure monitor.  If you are between 29 years and 27 years old, ask your health care provider if you should take aspirin to prevent strokes.  Have regular diabetes screenings. This involves taking a blood sample to check your fasting blood sugar level. ? If you are at a normal weight and  have a low risk for diabetes, have this test once every three years after 51 years of age. ? If you are overweight and have a high risk for diabetes, consider being tested at a younger age or more often. Preventing infection Hepatitis B  If you have a higher risk for hepatitis B, you should be screened for this virus. You are considered at high risk for hepatitis B if: ? You were born in a country where hepatitis B is common. Ask your health care provider which countries are considered high risk. ? Your parents were born in a high-risk country, and you have not been immunized against hepatitis B (hepatitis B vaccine). ? You have HIV or AIDS. ? You use needles to inject street drugs. ? You live with someone who has hepatitis B. ? You have had sex with someone who has hepatitis B. ? You get hemodialysis treatment. ? You take certain medicines for conditions, including cancer, organ transplantation, and autoimmune conditions.  Hepatitis C  Blood testing is recommended for: ? Everyone born from [birth year] through 1965. ? Anyone with known risk factors for hepatitis C.  Sexually transmitted infections (STIs)  You should be screened for sexually transmitted infections (STIs) including gonorrhea and chlamydia if: ? You are sexually active and are younger than 51 years of age. ? You are older than 51 years of age and your health care provider tells you that you are at risk for this type of infection. ? Your sexual activity has changed since you were last screened and you are at an increased risk for chlamydia or gonorrhea. Ask your health care provider if you are at risk.  If you do not have HIV, but are at risk, it may be recommended that you take a prescription medicine daily to prevent HIV infection. This is called pre-exposure prophylaxis (PrEP). You are considered at risk if: ? You are sexually active and do not regularly use condoms or know the HIV status of your partner(s). ? You take drugs by  injection. ? You are sexually active with a partner who has HIV.  Talk with your health care provider about whether you are at high risk of being infected with HIV. If you choose to begin PrEP, you should first be tested for HIV. You should then be tested every 3 months for as long as you are taking PrEP. Pregnancy  If you are premenopausal and you may become pregnant, ask your health care  provider about preconception counseling.  If you may become pregnant, take 400 to 800 micrograms (mcg) of folic acid every day.  If you want to prevent pregnancy, talk to your health care provider about birth control (contraception). Osteoporosis and menopause  Osteoporosis is a disease in which the bones lose minerals and strength with aging. This can result in serious bone fractures. Your risk for osteoporosis can be identified using a bone density scan.  If you are 35 years of age or older, or if you are at risk for osteoporosis and fractures, ask your health care provider if you should be screened.  Ask your health care provider whether you should take a calcium or vitamin D supplement to lower your risk for osteoporosis.  Menopause may have certain physical symptoms and risks.  Hormone replacement therapy may reduce some of these symptoms and risks. Talk to your health care provider about whether hormone replacement therapy is right for you. Follow these instructions at home:  Schedule regular health, dental, and eye exams.  Stay current with your immunizations.  Do not use any tobacco products including cigarettes, chewing tobacco, or electronic cigarettes.  If you are pregnant, do not drink alcohol.  If you are breastfeeding, limit how much and how often you drink alcohol.  Limit alcohol intake to no more than 1 drink per day for nonpregnant women. One drink equals 12 ounces of beer, 5 ounces of wine, or 1 ounces of hard liquor.  Do not use street drugs.  Do not share needles.  Ask  your health care provider for help if you need support or information about quitting drugs.  Tell your health care provider if you often feel depressed.  Tell your health care provider if you have ever been abused or do not feel safe at home. This information is not intended to replace advice given to you by your health care provider. Make sure you discuss any questions you have with your health care provider. Document Released: 04/02/2011 Document Revised: 02/23/2016 Document Reviewed: 06/21/2015 Elsevier Interactive Patient Education  [employer].

## 2018-06-17 NOTE — Telephone Encounter (Signed)
Patient was seen today and forgot to ask about medication refill on Prozac 10 mg x1year. Okay to send?

## 2018-06-17 NOTE — Progress Notes (Signed)
Cathy Kirby May 25, 1966 [id number]    History:    Presents for annual exam.  Lighter cycles since 01/2017 after her option , good relief of menorrhagia.  Benign  endometrial polyp.  Occasional menopausal symptoms.  Osteoarthritis positive ANA.  Normal Pap and mammogram history.  Partner vasectomy.  Has not had a screening colonoscopy.  Past medical history, past surgical history, family history and social history were all reviewed and documented in the EPIC chart.  Works in administration with people with developmental delays.  2 sons ages 2716 both doing well.  ROS:  A ROS was performed and pertinent positives and negatives are included.  Exam:  Vitals:   06/17/18 0907  BP: 120/72  Weight: 201 lb (91.2 kg)  Height: 5\' 3"  (1.6 m)   Body mass index is 35.61 kg/m.   General appearance:  Normal Thyroid:  Symmetrical, normal in size, without palpable masses or nodularity. Respiratory  Auscultation:  Clear without wheezing or rhonchi Cardiovascular  Auscultation:  Regular rate, without rubs, murmurs or gallops  Edema/varicosities:  Not grossly evident Abdominal  Soft,nontender, without masses, guarding or rebound.  Liver/spleen:  No organomegaly noted  Hernia:  None appreciated  Skin  Inspection:  Grossly normal   Breasts: Examined lying and sitting.     Right: Without masses, retractions, discharge or axillary adenopathy.     Left: Without masses, retractions, discharge or axillary adenopathy. Gentitourinary   Inguinal/mons:  Normal without inguinal adenopathy  External genitalia:  Normal  BUS/Urethra/Skene's glands:  Normal  Vagina:  Normal  Cervix:  Normal  Uterus:  normal in size, shape and contour.  Midline and mobile  Adnexa/parametria:     Rt: Without masses or tenderness.   Lt: Without masses or tenderness.  Anus and perineum: Normal  Digital rectal exam: Normal sphincter tone without palpated masses or tenderness  Assessment/Plan:  52 y.o. WF G4 P2 for  annual exam with no GYN complaints.  01/2017 her option lighter cycles/vasectomy Osteoarthritis/positive ANA Obesity PMS type symptoms relieved with Prozac  Plan: Menopause reviewed, having occasional hot flushes, will keep menstrual record.  SBE's, continue annual screening mammogram, calcium rich foods, vitamin D 2000 daily encouraged.  Reviewed importance of weight loss for joints/health, decreasing calories increasing exercise.  Prozac 10 mg p.o. daily day 1 through 14 of each month and then 20 mg 14 through 28.  [facility] information given instructed to schedule screening colonoscopy.  CBC, CMP, lipid panel, Pap with HR HPV typing, new screening guidelines reviewed.Harrington Challenger.    Kimmora Risenhoover J Paislynn He[facility] IncWHNP, 11:30 AM 06/17/2018

## 2018-06-19 LAB — PAP, TP IMAGING W/ HPV RNA, RFLX HPV TYPE 16,18/45: HPV DNA High Risk: NOT DETECTED

## 2019-06-24 ENCOUNTER — Encounter: Payer: Self-pay | Admitting: Gynecology

## 2019-09-07 ENCOUNTER — Telehealth: Payer: Self-pay | Admitting: *Deleted

## 2019-09-07 DIAGNOSIS — F4323 Adjustment disorder with mixed anxiety and depressed mood: Secondary | ICD-10-CM

## 2019-09-07 NOTE — Telephone Encounter (Signed)
Okay for refill?  

## 2019-09-07 NOTE — Telephone Encounter (Signed)
Patient called requesting refill on Prozac 10 mg capsule.  Annual exam scheduled on 10/12/19

## 2019-09-09 MED ORDER — FLUOXETINE HCL 10 MG PO CAPS: ORAL_CAPSULE | ORAL | 0 refills | Status: DC

## 2019-09-09 NOTE — Telephone Encounter (Signed)
Rx sent 

## 2019-10-12 ENCOUNTER — Encounter: Payer: BC Managed Care – PPO | Admitting: Women's Health

## 2019-11-03 ENCOUNTER — Other Ambulatory Visit: Payer: Self-pay | Admitting: Women's Health

## 2019-11-03 DIAGNOSIS — F4323 Adjustment disorder with mixed anxiety and depressed mood: Secondary | ICD-10-CM

## 2019-11-16 ENCOUNTER — Other Ambulatory Visit: Payer: Self-pay

## 2019-11-16 ENCOUNTER — Ambulatory Visit: Payer: BC Managed Care – PPO | Admitting: Women's Health

## 2019-11-16 ENCOUNTER — Encounter: Payer: Self-pay | Admitting: Women's Health

## 2019-11-16 VITALS — BP 126/80 | Ht 63.0 in | Wt 207.0 lb

## 2019-11-16 DIAGNOSIS — Z13220 Encounter for screening for lipoid disorders: Secondary | ICD-10-CM | POA: Diagnosis not present

## 2019-11-16 DIAGNOSIS — Z01419 Encounter for gynecological examination (general) (routine) without abnormal findings: Secondary | ICD-10-CM

## 2019-11-16 DIAGNOSIS — Z23 Encounter for immunization: Secondary | ICD-10-CM

## 2019-11-16 MED ORDER — FLUOXETINE HCL 20 MG PO CAPS: 20.0000 mg | ORAL_CAPSULE | Freq: Every day | ORAL | 4 refills | Status: DC

## 2019-11-16 NOTE — Progress Notes (Signed)
Derrick L Motta-Payne 13-Jul-1966 [id number]    History:    Presents for annual exam.  Light monthly cycles becoming more irregular.  Rare menopausal symptoms.  01/2017 HER option with good relief of menorrhagia.  Husband vasectomy.  Normal Pap and mammogram history.  Has not had a screening colonoscopy.  Osteoarthritis, positive ANA.  Past medical history, past surgical history, family history and social history were all reviewed and documented in the EPIC chart.  Works in administration with people with developmental delays.  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  ROS:  A ROS was performed and pertinent positives and negatives are included.  Exam:  Vitals:   11/16/19 1052  BP: 126/80  Weight: 207 lb (93.9 kg)  Height: 5\' 3"  (1.6 m)   Body mass index is 36.67 kg/m.   General appearance:  Normal Thyroid:  Symmetrical, normal in size, without palpable masses or nodularity. Respiratory  Auscultation:  Clear without wheezing or rhonchi Cardiovascular  Auscultation:  Regular rate, without rubs, murmurs or gallops  Edema/varicosities:  Not grossly evident Abdominal  Soft,nontender, without masses, guarding or rebound.  Liver/spleen:  No organomegaly noted  Hernia:  None appreciated  Skin  Inspection:  Grossly normal   Breasts: Examined lying and sitting.     Right: Without masses, retractions, discharge or axillary adenopathy.     Left: Without masses, retractions, discharge or axillary adenopathy. Gentitourinary   Inguinal/mons:  Normal without inguinal adenopathy  External genitalia:  Normal  BUS/Urethra/Skene's glands:  Normal  Vagina:  Normal  Cervix:  Normal  Uterus:  normal in size, shape and contour.  Midline and mobile  Adnexa/parametria:     Rt: Without masses or tenderness.   Lt: Without masses or tenderness.  Anus and  perineum: Normal  Digital rectal exam: Normal sphincter tone without palpated masses or tenderness  Assessment/Plan:  54 y.o. MWF G4, P2 for annual exam with no complaints of vaginal discharge, urinary symptoms, GI problems or abdominal pain.  Monthly light cycles/ vasectomy 01/2017 her option good relief of menorrhagia Anxiety-Prozac 20 mg Osteoarthritis/positive ANA  Plan: Menopause reviewed no symptoms other than cycles becoming less regular but still monthly.  Prozac 20 mg p.o. daily prescription, proper use given and reviewed, counseling as needed, self-care, leisure activities encouraged.  SBEs, continue annual 3D screening mammogram overdue instructed to schedule.  Screening colonoscopy discussed, [facility] information given instructed to schedule.  Aware of importance of decreasing calories/carbs, increasing weightbearing exercise, balance type exercise such as yoga encouraged.  CBC, CMP, lipid panel, Pap normal 06/2018, new screening guidelines reviewed.07/2018 [place] [facility], 1:21 PM 11/16/2019

## 2019-11-16 NOTE — Patient Instructions (Signed)
Good to see you today! Vit D 3  2000 iu daily Health Maintenance, Female Adopting a healthy lifestyle and getting preventive care are important in promoting health and wellness. Ask your health care provider about:  The right schedule for you to have regular tests and exams.  Things you can do on your own to prevent diseases and keep yourself healthy. What should I know about diet, weight, and exercise? Eat a healthy diet   Eat a diet that includes plenty of vegetables, fruits, low-fat dairy products, and lean protein.  Do not eat a lot of foods that are high in solid fats, added sugars, or sodium. Maintain a healthy weight Body mass index (BMI) is used to identify weight problems. It estimates body fat based on height and weight. Your health care provider can help determine your BMI and help you achieve or maintain a healthy weight. Get regular exercise Get regular exercise. This is one of the most important things you can do for your health. Most adults should:  Exercise for at least 150 minutes each week. The exercise should increase your heart rate and make you sweat (moderate-intensity exercise).  Do strengthening exercises at least twice a week. This is in addition to the moderate-intensity exercise.  Spend less time sitting. Even light physical activity can be beneficial. Watch cholesterol and blood lipids Have your blood tested for lipids and cholesterol at 53 years of age, then have this test every 5 years. Have your cholesterol levels checked more often if:  Your lipid or cholesterol levels are high.  You are older than 53 years of age.  You are at high risk for heart disease. What should I know about cancer screening? Depending on your health history and family history, you may need to have cancer screening at various ages. This may include screening for:  Breast cancer.  Cervical cancer.  Colorectal cancer.  Skin cancer.  Lung cancer. What should I know about  heart disease, diabetes, and high blood pressure? Blood pressure and heart disease  High blood pressure causes heart disease and increases the risk of stroke. This is more likely to develop in people who have high blood pressure readings, are of African descent, or are overweight.  Have your blood pressure checked: ? Every 3-5 years if you are 57-38 years of age. ? Every year if you are 16 years old or older. Diabetes Have regular diabetes screenings. This checks your fasting blood sugar level. Have the screening done:  Once every three years after age 42 if you are at a normal weight and have a low risk for diabetes.  More often and at a younger age if you are overweight or have a high risk for diabetes. What should I know about preventing infection? Hepatitis B If you have a higher risk for hepatitis B, you should be screened for this virus. Talk with your health care provider to find out if you are at risk for hepatitis B infection. Hepatitis C Testing is recommended for:  Everyone born from [birth year] through 1964.  Anyone with known risk factors for hepatitis C. Sexually transmitted infections (STIs)  Get screened for STIs, including gonorrhea and chlamydia, if: ? You are sexually active and are younger than 53 years of age. ? You are older than 53 years of age and your health care provider tells you that you are at risk for this type of infection. ? Your sexual activity has changed since you were last screened, and you are at  increased risk for chlamydia or gonorrhea. Ask your health care provider if you are at risk.  Ask your health care provider about whether you are at high risk for HIV. Your health care provider may recommend a prescription medicine to help prevent HIV infection. If you choose to take medicine to prevent HIV, you should first get tested for HIV. You should then be tested every 3 months for as long as you are taking the medicine. Pregnancy  If you are about to  stop having your period (premenopausal) and you may become pregnant, seek counseling before you get pregnant.  Take 400 to 800 micrograms (mcg) of folic acid every day if you become pregnant.  Ask for birth control (contraception) if you want to prevent pregnancy. Osteoporosis and menopause Osteoporosis is a disease in which the bones lose minerals and strength with aging. This can result in bone fractures. If you are 41 years old or older, or if you are at risk for osteoporosis and fractures, ask your health care provider if you should:  Be screened for bone loss.  Take a calcium or vitamin D supplement to lower your risk of fractures.  Be given hormone replacement therapy (HRT) to treat symptoms of menopause. Follow these instructions at home: Lifestyle  Do not use any products that contain nicotine or tobacco, such as cigarettes, e-cigarettes, and chewing tobacco. If you need help quitting, ask your health care provider.  Do not use street drugs.  Do not share needles.  Ask your health care provider for help if you need support or information about quitting drugs. Alcohol use  Do not drink alcohol if: ? Your health care provider tells you not to drink. ? You are pregnant, may be pregnant, or are planning to become pregnant.  If you drink alcohol: ? Limit how much you use to 0-1 drink a day. ? Limit intake if you are breastfeeding.  Be aware of how much alcohol is in your drink. In the [place] one drink equals one 12 oz bottle of beer (355 mL), one 5 oz glass of wine (148 mL), or one 1 oz glass of hard liquor (44 mL). General instructions  Schedule regular health, dental, and eye exams.  Stay current with your vaccines.  Tell your health care provider if: ? You often feel depressed. ? You have ever been abused or do not feel safe at home. Summary  Adopting a healthy lifestyle and getting preventive care are important in promoting health and wellness.  Follow your  health care provider's instructions about healthy diet, exercising, and getting tested or screened for diseases.  Follow your health care provider's instructions on monitoring your cholesterol and blood pressure. This information is not intended to replace advice given to you by your health care provider. Make sure you discuss any questions you have with your health care provider. Document Revised: 09/10/2018 Document Reviewed: 09/10/2018 Elsevier Patient Education  2020 [employer].

## 2019-11-17 LAB — CBC WITH DIFFERENTIAL/PLATELET
Absolute Monocytes: 848 cells/uL (ref 200–950)
Basophils Absolute: 83 cells/uL (ref 0–200)
Basophils Relative: 1.1 %
Eosinophils Absolute: 210 cells/uL (ref 15–500)
Eosinophils Relative: 2.8 %
HCT: 42.2 % (ref 35.0–45.0)
Hemoglobin: 14.1 g/dL (ref 11.7–15.5)
Lymphs Abs: 3308 cells/uL (ref 850–3900)
MCH: 29.2 pg (ref 27.0–33.0)
MCHC: 33.4 g/dL (ref 32.0–36.0)
MCV: 87.4 fL (ref 80.0–100.0)
MPV: 11.9 fL (ref 7.5–12.5)
Monocytes Relative: 11.3 %
Neutro Abs: 3053 cells/uL (ref 1500–7800)
Neutrophils Relative %: 40.7 %
Platelets: 277 10*3/uL (ref 140–400)
RBC: 4.83 10*6/uL (ref 3.80–5.10)
RDW: 12.8 % (ref 11.0–15.0)
Total Lymphocyte: 44.1 %
WBC: 7.5 10*3/uL (ref 3.8–10.8)

## 2019-11-17 LAB — COMPREHENSIVE METABOLIC PANEL
AG Ratio: 1.5 (calc) (ref 1.0–2.5)
ALT: 17 U/L (ref 6–29)
AST: 15 U/L (ref 10–35)
Albumin: 4.3 g/dL (ref 3.6–5.1)
Alkaline phosphatase (APISO): 55 U/L (ref 37–153)
BUN: 18 mg/dL (ref 7–25)
CO2: 26 mmol/L (ref 20–32)
Calcium: 9.7 mg/dL (ref 8.6–10.4)
Chloride: 105 mmol/L (ref 98–110)
Creat: 0.87 mg/dL (ref 0.50–1.05)
Globulin: 2.9 g/dL (calc) (ref 1.9–3.7)
Glucose, Bld: 81 mg/dL (ref 65–99)
Potassium: 4.5 mmol/L (ref 3.5–5.3)
Sodium: 138 mmol/L (ref 135–146)
Total Bilirubin: 0.5 mg/dL (ref 0.2–1.2)
Total Protein: 7.2 g/dL (ref 6.1–8.1)

## 2019-11-17 LAB — LIPID PANEL
Cholesterol: 254 mg/dL — ABNORMAL HIGH (ref <200)
HDL: 58 mg/dL (ref >=50)
LDL Cholesterol (Calc): 172 mg/dL (calc) — ABNORMAL HIGH
Non-HDL Cholesterol (Calc): 196 mg/dL (calc) — ABNORMAL HIGH (ref <130)
Total CHOL/HDL Ratio: 4.4 (calc) (ref <5.0)
Triglycerides: 115 mg/dL (ref <150)

## 2019-11-23 ENCOUNTER — Encounter: Payer: Self-pay | Admitting: Women's Health

## 2020-12-07 ENCOUNTER — Encounter: Payer: Self-pay | Admitting: Obstetrics & Gynecology

## 2020-12-26 ENCOUNTER — Other Ambulatory Visit: Payer: Self-pay | Admitting: *Deleted

## 2020-12-26 NOTE — Telephone Encounter (Signed)
Medication refill request: Fluoxetine Last AEX:  11-16-19 [place] Next AEX: 02-16-21 ML  Last MMG (if hormonal medication request): n/a Refill authorized: Today, please advise.   Medication pended for #90, 0RF. Please refill if appropriate.

## 2020-12-28 MED ORDER — FLUOXETINE HCL 20 MG PO CAPS: 20.0000 mg | ORAL_CAPSULE | Freq: Every day | ORAL | 0 refills | Status: DC

## 2021-02-16 ENCOUNTER — Telehealth: Payer: Self-pay | Admitting: *Deleted

## 2021-02-16 ENCOUNTER — Other Ambulatory Visit: Payer: Self-pay

## 2021-02-16 ENCOUNTER — Encounter: Payer: Self-pay | Admitting: Obstetrics & Gynecology

## 2021-02-16 ENCOUNTER — Other Ambulatory Visit (HOSPITAL_COMMUNITY)
Admission: RE | Admit: 2021-02-16 | Discharge: 2021-02-16 | Disposition: A | Discharge status: Home / Self Care | Payer: BC Managed Care – PPO | Source: Ambulatory Visit | Attending: Obstetrics & Gynecology | Admitting: Obstetrics & Gynecology

## 2021-02-16 ENCOUNTER — Ambulatory Visit (INDEPENDENT_AMBULATORY_CARE_PROVIDER_SITE_OTHER): Payer: BC Managed Care – PPO | Admitting: Obstetrics & Gynecology

## 2021-02-16 VITALS — BP 124/84 | HR 78 | Resp 16 | Ht 62.0 in | Wt 199.5 lb

## 2021-02-16 DIAGNOSIS — F4323 Adjustment disorder with mixed anxiety and depressed mood: Secondary | ICD-10-CM | POA: Diagnosis not present

## 2021-02-16 DIAGNOSIS — Z01419 Encounter for gynecological examination (general) (routine) without abnormal findings: Secondary | ICD-10-CM

## 2021-02-16 DIAGNOSIS — N904 Leukoplakia of vulva: Secondary | ICD-10-CM | POA: Diagnosis not present

## 2021-02-16 DIAGNOSIS — R768 Other specified abnormal immunological findings in serum: Secondary | ICD-10-CM

## 2021-02-16 DIAGNOSIS — M1990 Unspecified osteoarthritis, unspecified site: Secondary | ICD-10-CM

## 2021-02-16 DIAGNOSIS — Z9852 Vasectomy status: Secondary | ICD-10-CM

## 2021-02-16 MED ORDER — CLOBETASOL PROPIONATE 0.05 % EX CREA: 1.0000 "application " | TOPICAL_CREAM | Freq: Every day | CUTANEOUS | 4 refills | Status: AC

## 2021-02-16 MED ORDER — FLUOXETINE HCL 20 MG PO CAPS: 20.0000 mg | ORAL_CAPSULE | Freq: Every day | ORAL | 4 refills | Status: DC

## 2021-02-16 NOTE — Progress Notes (Signed)
Cathy Kirby 06/03/1966 [id number]   History:    55 y.o.  [id number] Married  RP:  Established patient presenting for annual gyn exam   HPI: Light monthly cycles.  Rare menopausal symptoms.  01/2017 HER option with good relief of menorrhagia.  Husband vasectomy.  Normal Pap and mammogram history.  Colono <10 yrs.  Osteoarthritis, positive ANA.  C/O bilateral hand tremor and bilateral arm weakness progressing x 1 year.  BMI 36.49.  Depressive Sxs/anxiety stable on Prozac.  Health labs here today.   Past medical history,surgical history, family history and social history were all reviewed and documented in the EPIC chart.  Gynecologic History Patient's last menstrual period was 01/13/2021.  Obstetric History OB History  Gravida Para Term Preterm AB Living  '4 2     1 2  ' SAB IAB Ectopic Multiple Live Births      1        # Outcome Date GA Lbr Len/2nd Weight Sex Delivery Anes PTL Lv  4 Para           3 Para           2 Ectopic           1 Gravida              ROS: A ROS was performed and pertinent positives and negatives are included in the history.  GENERAL: No fevers or chills. HEENT: No change in vision, no earache, sore throat or sinus congestion. NECK: No pain or stiffness. CARDIOVASCULAR: No chest pain or pressure. No palpitations. PULMONARY: No shortness of breath, cough or wheeze. GASTROINTESTINAL: No abdominal pain, nausea, vomiting or diarrhea, melena or bright red blood per rectum. GENITOURINARY: No urinary frequency, urgency, hesitancy or dysuria. MUSCULOSKELETAL: No joint or muscle pain, no back pain, no recent trauma. DERMATOLOGIC: No rash, no itching, no lesions. ENDOCRINE: No polyuria, polydipsia, no heat or cold intolerance. No recent change in weight. HEMATOLOGICAL: No anemia or easy bruising or bleeding. NEUROLOGIC: No headache, seizures, numbness, tingling or weakness. PSYCHIATRIC: No depression, no loss of interest in normal activity or change in sleep  pattern.     Exam:   BP 124/84 (BP Location: Right Arm, Patient Position: Sitting, Cuff Size: Large)   Pulse 78   Resp 16   Ht '5\' 2"'  (1.575 m)   Wt 199 lb 8 oz (90.5 kg)   LMP 01/13/2021   BMI 36.49 kg/m   Body mass index is 36.49 kg/m.  General appearance : Well developed well nourished female. No acute distress HEENT: Eyes: no retinal hemorrhage or exudates,  Neck supple, trachea midline, no carotid bruits, no thyroidmegaly Lungs: Clear to auscultation, no rhonchi or wheezes, or rib retractions  Heart: Regular rate and rhythm, no murmurs or gallops Breast:Examined in sitting and supine position were symmetrical in appearance, no palpable masses or tenderness,  no skin retraction, no nipple inversion, no nipple discharge, no skin discoloration, no axillary or supraclavicular lymphadenopathy Abdomen: no palpable masses or tenderness, no rebound or guarding Extremities: no edema or skin discoloration or tenderness  Pelvic: Vulva: Butterfly white atrophy with disappearance of labia minora.             Vagina: No gross lesions or discharge  Cervix: No gross lesions or discharge.  Pap reflex done.  Uterus  AV, normal size, shape and consistency, non-tender and mobile  Adnexa  Without masses or tenderness  Anus: Normal   Assessment/Plan:  55 y.o. female for annual exam  1. Encounter for routine gynecological examination with Papanicolaou smear of cervix Normal gynecologic exam.  Pap reflex done.  Breast exam normal.  Screening mammogram March 2022 was negative.  Colonoscopy within the last 10 years.  Fasting health labs here today. - CBC - Comp Met (CMET) - TSH - Lipid Profile - Vitamin D 1,25 dihydroxy - Cytology - PAP( [place])  2. H/O: vasectomy  3. Positive ANA (antinuclear antibody) History of positive ANA.  Patient with arthritis, probable rheumatoid arthritis.  Bilateral hand tremors and bilateral arm weakness is progressing for the last year.  Lichen sclerosus  of the vulva per exam today, which I will manage.  Decision to refer to rheumatologist for investigation and management.  4. Adjustment disorder with mixed anxiety and depressed mood Stable on Prozac.  No contraindication to continue.  Prescription sent to pharmacy.  5. Lichen sclerosus et atrophicus of the vulva Lichen sclerosus of the vulva per exam today.  Counseling done on lichen sclerosus.  Decision to treat with clobetasol 0.05% cream.  Usage reviewed and prescription sent to pharmacy.  Other orders - FAMOTIDINE MAXIMUM STRENGTH PO - FLUoxetine (PROZAC) 20 MG capsule; Take 1 capsule (20 mg total) by mouth daily. - clobetasol cream (TEMOVATE) 0.05 %; Apply 1 application topically daily for 14 days. Thin application to affected vulva daily x 2 weeks, then twice a week long term.  Princess Bruins MD, 8:19 AM 02/16/2021

## 2021-02-16 NOTE — Telephone Encounter (Signed)
Referral placed at [facility] they will call to schedule. 

## 2021-02-16 NOTE — Telephone Encounter (Signed)
-----   Message from Genia Del, MD sent at 02/16/2021  8:43 AM EDT ----- Regarding: Refer to Rheumatology ANA pos, arthritis, upper body neuro symptoms.

## 2021-02-17 LAB — CYTOLOGY - PAP: Diagnosis: NEGATIVE

## 2021-02-19 ENCOUNTER — Encounter (HOSPITAL_BASED_OUTPATIENT_CLINIC_OR_DEPARTMENT_OTHER): Payer: Self-pay

## 2021-02-19 ENCOUNTER — Emergency Department (HOSPITAL_BASED_OUTPATIENT_CLINIC_OR_DEPARTMENT_OTHER): Payer: BC Managed Care – PPO

## 2021-02-19 ENCOUNTER — Other Ambulatory Visit: Payer: Self-pay

## 2021-02-19 ENCOUNTER — Observation Stay (HOSPITAL_BASED_OUTPATIENT_CLINIC_OR_DEPARTMENT_OTHER)
Admission: EM | Admit: 2021-02-19 | Discharge: 2021-02-21 | Disposition: A | Discharge status: Home / Self Care | Payer: BC Managed Care – PPO | Attending: General Surgery | Admitting: General Surgery

## 2021-02-19 DIAGNOSIS — Z20822 Contact with and (suspected) exposure to covid-19: Secondary | ICD-10-CM | POA: Diagnosis not present

## 2021-02-19 DIAGNOSIS — K8012 Calculus of gallbladder with acute and chronic cholecystitis without obstruction: Principal | ICD-10-CM | POA: Insufficient documentation

## 2021-02-19 DIAGNOSIS — Z79899 Other long term (current) drug therapy: Secondary | ICD-10-CM | POA: Diagnosis not present

## 2021-02-19 DIAGNOSIS — K8000 Calculus of gallbladder with acute cholecystitis without obstruction: Secondary | ICD-10-CM | POA: Diagnosis present

## 2021-02-19 DIAGNOSIS — R1011 Right upper quadrant pain: Secondary | ICD-10-CM | POA: Diagnosis present

## 2021-02-19 DIAGNOSIS — N920 Excessive and frequent menstruation with regular cycle: Secondary | ICD-10-CM

## 2021-02-19 DIAGNOSIS — K810 Acute cholecystitis: Secondary | ICD-10-CM

## 2021-02-19 LAB — CBC
HCT: 42.9 % (ref 36.0–46.0)
Hemoglobin: 14.7 g/dL (ref 12.0–15.0)
MCH: 28.1 pg (ref 26.0–34.0)
MCHC: 34.3 g/dL (ref 30.0–36.0)
MCV: 81.9 fL (ref 80.0–100.0)
Platelets: 304 10*3/uL (ref 150–400)
RBC: 5.24 MIL/uL — ABNORMAL HIGH (ref 3.87–5.11)
RDW: 14.1 % (ref 11.5–15.5)
WBC: 13 10*3/uL — ABNORMAL HIGH (ref 4.0–10.5)
nRBC: 0 % (ref 0.0–0.2)

## 2021-02-19 LAB — RESP PANEL BY RT-PCR (FLU A&B, COVID) ARPGX2
Influenza A by PCR: NEGATIVE
Influenza B by PCR: NEGATIVE
SARS Coronavirus 2 by RT PCR: NEGATIVE

## 2021-02-19 LAB — URINALYSIS, ROUTINE W REFLEX MICROSCOPIC
Bilirubin Urine: NEGATIVE
Glucose, UA: NEGATIVE mg/dL
Hgb urine dipstick: NEGATIVE
Leukocytes,Ua: NEGATIVE
Nitrite: NEGATIVE
Protein, ur: NEGATIVE mg/dL
Specific Gravity, Urine: 1.016 (ref 1.005–1.030)
pH: 5 (ref 5.0–8.0)

## 2021-02-19 LAB — COMPREHENSIVE METABOLIC PANEL
ALT: 17 U/L (ref 0–44)
AST: 17 U/L (ref 15–41)
Albumin: 4.3 g/dL (ref 3.5–5.0)
Alkaline Phosphatase: 53 U/L (ref 38–126)
Anion gap: 11 (ref 5–15)
BUN: 16 mg/dL (ref 6–20)
CO2: 22 mmol/L (ref 22–32)
Calcium: 9.4 mg/dL (ref 8.9–10.3)
Chloride: 106 mmol/L (ref 98–111)
Creatinine, Ser: 0.82 mg/dL (ref 0.44–1.00)
GFR, Estimated: >60 mL/min (ref >60)
Glucose, Bld: 122 mg/dL — ABNORMAL HIGH (ref 70–99)
Potassium: 4.1 mmol/L (ref 3.5–5.1)
Sodium: 139 mmol/L (ref 135–145)
Total Bilirubin: 0.4 mg/dL (ref 0.3–1.2)
Total Protein: 7.7 g/dL (ref 6.5–8.1)

## 2021-02-19 LAB — LIPASE, BLOOD: Lipase: <10 U/L — ABNORMAL LOW (ref 11–51)

## 2021-02-19 LAB — PREGNANCY, URINE: Preg Test, Ur: NEGATIVE

## 2021-02-19 MED ORDER — ACETAMINOPHEN 325 MG PO TABS: 650.0000 mg | ORAL_TABLET | Freq: Four times a day (QID) | ORAL | Status: DC | PRN

## 2021-02-19 MED ORDER — METHOCARBAMOL 500 MG PO TABS: 500.0000 mg | ORAL_TABLET | Freq: Four times a day (QID) | ORAL | Status: DC | PRN

## 2021-02-19 MED ORDER — OXYCODONE HCL 5 MG PO TABS
5.0000 mg | ORAL_TABLET | ORAL | Status: DC | PRN
Start: 2021-02-19 — End: 2021-02-21
  Administered 2021-02-20 – 2021-02-21 (×2): 5 mg via ORAL
  Filled 2021-02-19 (×2): qty 1

## 2021-02-19 MED ORDER — SIMETHICONE 80 MG PO CHEW
40.0000 mg | CHEWABLE_TABLET | Freq: Four times a day (QID) | ORAL | Status: DC | PRN
  Filled 2021-02-19: qty 1

## 2021-02-19 MED ORDER — ONDANSETRON HCL 4 MG/2ML IJ SOLN
4.0000 mg | Freq: Four times a day (QID) | INTRAMUSCULAR | Status: DC | PRN
  Administered 2021-02-20: 4 mg via INTRAVENOUS
  Filled 2021-02-19: qty 2

## 2021-02-19 MED ORDER — ONDANSETRON 4 MG PO TBDP: 4.0000 mg | ORAL_TABLET | Freq: Four times a day (QID) | ORAL | Status: DC | PRN

## 2021-02-19 MED ORDER — MORPHINE SULFATE (PF) 4 MG/ML IV SOLN
4.0000 mg | Freq: Once | INTRAVENOUS | Status: AC
  Administered 2021-02-19: 4 mg via INTRAVENOUS
  Filled 2021-02-19: qty 1

## 2021-02-19 MED ORDER — ACETAMINOPHEN 650 MG RE SUPP: 650.0000 mg | Freq: Four times a day (QID) | RECTAL | Status: DC | PRN

## 2021-02-19 MED ORDER — IBUPROFEN 200 MG PO TABS: 600.0000 mg | ORAL_TABLET | Freq: Three times a day (TID) | ORAL | Status: DC | PRN

## 2021-02-19 MED ORDER — FAMOTIDINE 20 MG PO TABS
20.0000 mg | ORAL_TABLET | Freq: Every day | ORAL | Status: DC
  Filled 2021-02-19: qty 1

## 2021-02-19 MED ORDER — ENOXAPARIN SODIUM 40 MG/0.4ML IJ SOSY: 40.0000 mg | PREFILLED_SYRINGE | Freq: Every day | INTRAMUSCULAR | Status: DC

## 2021-02-19 MED ORDER — POLYETHYLENE GLYCOL 3350 17 G PO PACK: 17.0000 g | PACK | Freq: Every day | ORAL | Status: DC | PRN

## 2021-02-19 MED ORDER — PROCHLORPERAZINE MALEATE 5 MG PO TABS: 10.0000 mg | ORAL_TABLET | Freq: Four times a day (QID) | ORAL | Status: DC | PRN

## 2021-02-19 MED ORDER — PROCHLORPERAZINE EDISYLATE 10 MG/2ML IJ SOLN: 5.0000 mg | Freq: Four times a day (QID) | INTRAMUSCULAR | Status: DC | PRN

## 2021-02-19 MED ORDER — SODIUM CHLORIDE 0.9 % IV SOLN
2.0000 g | Freq: Every day | INTRAVENOUS | Status: DC
  Administered 2021-02-20: 2 g via INTRAVENOUS
  Filled 2021-02-19: qty 20
  Filled 2021-02-19: qty 2

## 2021-02-19 MED ORDER — ONDANSETRON HCL 4 MG/2ML IJ SOLN
4.0000 mg | Freq: Once | INTRAMUSCULAR | Status: AC
  Administered 2021-02-19: 4 mg via INTRAVENOUS
  Filled 2021-02-19: qty 2

## 2021-02-19 MED ORDER — MELATONIN 3 MG PO TABS
3.0000 mg | ORAL_TABLET | Freq: Every evening | ORAL | Status: DC | PRN
  Administered 2021-02-20: 3 mg via ORAL
  Filled 2021-02-19: qty 1

## 2021-02-19 MED ORDER — MORPHINE SULFATE (PF) 2 MG/ML IV SOLN
1.0000 mg | INTRAVENOUS | Status: DC | PRN
  Administered 2021-02-20 (×2): 2 mg via INTRAVENOUS
  Filled 2021-02-19 (×2): qty 1

## 2021-02-19 MED ORDER — DIPHENHYDRAMINE HCL 50 MG/ML IJ SOLN: 12.5000 mg | Freq: Four times a day (QID) | INTRAMUSCULAR | Status: DC | PRN

## 2021-02-19 MED ORDER — SENNA 8.6 MG PO TABS
1.0000 | ORAL_TABLET | Freq: Two times a day (BID) | ORAL | Status: DC
  Administered 2021-02-20: 8.6 mg via ORAL
  Filled 2021-02-19 (×2): qty 1

## 2021-02-19 MED ORDER — KCL IN DEXTROSE-NACL 20-5-0.45 MEQ/L-%-% IV SOLN
INTRAVENOUS | Status: DC
  Filled 2021-02-19 (×3): qty 1000

## 2021-02-19 MED ORDER — FLUOXETINE HCL 20 MG PO CAPS
20.0000 mg | ORAL_CAPSULE | Freq: Every day | ORAL | Status: DC
  Filled 2021-02-19: qty 1

## 2021-02-19 MED ORDER — DIPHENHYDRAMINE HCL 12.5 MG/5ML PO ELIX: 12.5000 mg | ORAL_SOLUTION | Freq: Four times a day (QID) | ORAL | Status: DC | PRN

## 2021-02-19 MED ORDER — PIPERACILLIN-TAZOBACTAM 3.375 G IVPB 30 MIN
3.3750 g | Freq: Once | INTRAVENOUS | Status: AC
  Administered 2021-02-19: 3.375 g via INTRAVENOUS
  Filled 2021-02-19: qty 50

## 2021-02-19 NOTE — ED Provider Notes (Signed)
[facility] EMERGENCY DEPT Provider Note   CSN: [id number] Arrival date & time: 02/19/21  1547     History Chief Complaint  Patient presents with  . Abdominal Pain    Cathy Kirby is a 55 y.o. female.  HPI     55yo female presents with concern for abdominal pain. Woke from sleep at 6AM with severe epigastric and RUQ abdominal pain. Sharp, severe. Has had associated nausea and vomiting. No constipation or diarrhea. No fevers. No chest pain or dyspnea.   Past Medical History:  Diagnosis Date  . Endometrial polyp   . History of ectopic pregnancy    1993  righe side-- treated w/ methotraxate  . Menorrhagia   . Osteoarthritis    hands, feet  . PONV (postoperative nausea and vomiting)   . Wears glasses     Patient Active Problem List   Diagnosis Date Noted  . Acute calculous cholecystitis 02/19/2021  . Menorrhagia with regular cycle 07/27/2016  . Positive ANA (antinuclear antibody)   . H/O: vasectomy     Past Surgical History:  Procedure Laterality Date  . CESAREAN SECTION  1992;  05-30-2002  . DILATATION & CURETTAGE/HYSTEROSCOPY WITH MYOSURE N/A 10/19/2016   Procedure: DILATATION & CURETTAGE/HYSTEROSCOPY WITH MYOSURE;  Surgeon: Dara Lords, MD;  Location: [facility];  Service: Gynecology;  Laterality: N/A;  requesting 7:30am start time  Requests one hour OR time.  Marland Kitchen FOOT SURGERY Right 12/2012   joint fusion  . LAPAROTOMY FOR LSO FOR CYST AND TORSION  1982  . PELVIC LAPAROSCOPY  02/05/2001   DR. CILIBERTI--- FOR INFERTILITY  . TONSILLECTOMY       OB History    Gravida  4   Para  2   Term      Preterm      AB  1   Living  2     SAB      IAB      Ectopic  1   Multiple      Live Births              Family History  Problem Relation Age of Onset  . Diabetes Father   . Cancer Father        lung  . Arthritis Maternal Grandmother     Social History   Tobacco Use  . Smoking status: Never  Smoker  . Smokeless tobacco: Never Used  Vaping Use  . Vaping Use: Never used  Substance Use Topics  . Alcohol use: Yes    Alcohol/week: 0.0 standard drinks    Comment: occasional  . Drug use: No    Home Medications Prior to Admission medications   Medication Sig Start Date End Date Taking? Authorizing Provider  bismuth subsalicylate (PEPTO BISMOL) 262 MG chewable tablet Chew 524 mg by mouth as needed for indigestion or diarrhea or loose stools.   Yes [provider]  clobetasol cream (TEMOVATE) 0.05 % Apply 1 application topically daily for 14 days. Thin application to affected vulva daily x 2 weeks, then twice a week long term. 02/16/21 03/02/21 Yes Genia Del, MD  famotidine (PEPCID) 20 MG tablet Take 20 mg by mouth 2 (two) times daily as needed for heartburn or indigestion.   Yes [provider]  fish oil-omega-3 fatty acids 1000 MG capsule Take 2 g by mouth daily.   Yes [provider]  FLUoxetine (PROZAC) 20 MG capsule Take 1 capsule (20 mg total) by mouth daily. 02/16/21  Yes Lavoie,  Marie-Lyne, MD  Multiple Vitamin (MULTIVITAMIN) capsule Take 1 capsule by mouth daily.   Yes [provider]  VITAMIN D PO Take 1 tablet by mouth daily.   Yes [provider]    Allergies    Patient has no known allergies.  Review of Systems   Review of Systems  Constitutional: Negative for fever.  Respiratory: Negative for cough and shortness of breath.   Cardiovascular: Negative for chest pain.  Gastrointestinal: Positive for abdominal pain, nausea and vomiting. Negative for constipation and diarrhea.  Genitourinary: Positive for flank pain. Negative for dysuria.  Musculoskeletal: Positive for back pain.  Skin: Negative for rash.  Neurological: Negative for headaches.    Physical Exam Updated Vital Signs BP 138/79 (BP Location: Right Arm)   Pulse 72   Temp 97.8 F (36.6 C) (Oral)   Resp 18   Ht 5\' 2"  (1.575 m)   Wt 89.8 kg   LMP  01/17/2021   SpO2 96%   BMI 36.21 kg/m   Physical Exam Vitals and nursing note reviewed.  Constitutional:      General: She is not in acute distress.    Appearance: She is well-developed. She is not diaphoretic.  HENT:     Head: Normocephalic and atraumatic.  Eyes:     Conjunctiva/sclera: Conjunctivae normal.  Cardiovascular:     Rate and Rhythm: Normal rate and regular rhythm.     Heart sounds: Normal heart sounds. No murmur heard. No friction rub. No gallop.   Pulmonary:     Effort: Pulmonary effort is normal. No respiratory distress.     Breath sounds: Normal breath sounds. No wheezing or rales.  Abdominal:     General: There is no distension.     Palpations: Abdomen is soft.     Tenderness: There is abdominal tenderness in the right upper quadrant and epigastric area. There is no guarding. Positive signs include Murphy's sign.  Musculoskeletal:        General: No tenderness.     Cervical back: Normal range of motion.  Skin:    General: Skin is warm and dry.     Findings: No erythema or rash.  Neurological:     Mental Status: She is alert and oriented to person, place, and time.     ED Results / Procedures / Treatments   Labs (all labs ordered are listed, but only abnormal results are displayed) Labs Reviewed  LIPASE, BLOOD - Abnormal; Notable for the following components:      Result Value   Lipase <10 (*)    All other components within normal limits  COMPREHENSIVE METABOLIC PANEL - Abnormal; Notable for the following components:   Glucose, Bld 122 (*)    All other components within normal limits  CBC - Abnormal; Notable for the following components:   WBC 13.0 (*)    RBC 5.24 (*)    All other components within normal limits  URINALYSIS, ROUTINE W REFLEX MICROSCOPIC - Abnormal; Notable for the following components:   Ketones, ur TRACE (*)    All other components within normal limits  CBC - Abnormal; Notable for the following components:   WBC 14.3 (*)     All other components within normal limits  COMPREHENSIVE METABOLIC PANEL - Abnormal; Notable for the following components:   Glucose, Bld 168 (*)    AST 89 (*)    ALT 104 (*)    All other components within normal limits  CBC - Abnormal; Notable for the following components:  WBC 14.6 (*)    All other components within normal limits  RESP PANEL BY RT-PCR (FLU A&B, COVID) ARPGX2  SURGICAL PCR SCREEN  PREGNANCY, URINE  HIV ANTIBODY (ROUTINE TESTING W REFLEX)  CREATININE, SERUM  PROTIME-INR    EKG EKG Interpretation  Date/Time:  Sunday Feb 19 2021 16:22:09 EDT Ventricular Rate:  82 PR Interval:  144 QRS Duration: 78 QT Interval:  380 QTC Calculation: 443 R Axis:   70 Text Interpretation: Normal sinus rhythm Possible Left atrial enlargement Low voltage QRS Borderline ECG No previous ECGs available Confirmed by Alvira Monday ([id number]) on 02/19/2021 6:50:07 PM   Radiology US Abdomen Limited RUQ (LIVER/GB)  Result Date: 02/19/2021 CLINICAL DATA:  Right upper quadrant and epigastric pain for several days. Nausea and vomiting. EXAM: ULTRASOUND ABDOMEN LIMITED RIGHT UPPER QUADRANT COMPARISON:  None. FINDINGS: Gallbladder: The gallbladder is dilated measuring 16.6 x 4.6 x 4.4 cm. Multiple gallstones are seen, including a large stone in the gallbladder neck measuring 3.3 cm. No abnormal gallbladder wall thickening or pericholecystic fluid is seen, however the sonographer notes a positive sonographic Murphy sign during the exam. These findings are suspicious for acute cholecystitis. Common bile duct: Diameter: 5 mm, within normal limits. Liver: No focal lesion identified. Within normal limits in parenchymal echogenicity. Portal vein is patent on color Doppler imaging with normal direction of blood flow towards the liver. Other: 5 cm simple cyst incidentally noted in the right kidney. IMPRESSION: Findings suspicious for acute cholecystitis. No evidence of biliary ductal dilatation. Electronically  Signed   By: Danae Orleans M.D.   On: 02/19/2021 19:56    Procedures Procedures   Medications Ordered in ED Medications  ibuprofen (ADVIL) tablet 600 mg (has no administration in time range)  FLUoxetine (PROZAC) capsule 20 mg (20 mg Oral Not Given 02/20/21 1001)  famotidine (PEPCID) tablet 20 mg (20 mg Oral Not Given 02/20/21 1000)  enoxaparin (LOVENOX) injection 40 mg (has no administration in time range)  dextrose 5 % and 0.45 % NaCl with KCl 20 mEq/L infusion ( Intravenous New Bag/Given 02/20/21 0008)  cefTRIAXone (ROCEPHIN) 2 g in sodium chloride 0.9 % 100 mL IVPB (2 g Intravenous New Bag/Given 02/20/21 0009)  acetaminophen (TYLENOL) tablet 650 mg (has no administration in time range)    Or  acetaminophen (TYLENOL) suppository 650 mg (has no administration in time range)  oxyCODONE (Oxy IR/ROXICODONE) immediate release tablet 5-10 mg (has no administration in time range)  morphine 2 MG/ML injection 1-2 mg (2 mg Intravenous Given 02/20/21 0848)  melatonin tablet 3 mg (has no administration in time range)  methocarbamol (ROBAXIN) tablet 500 mg (has no administration in time range)  diphenhydrAMINE (BENADRYL) 12.5 MG/5ML elixir 12.5 mg (has no administration in time range)    Or  diphenhydrAMINE (BENADRYL) injection 12.5 mg (has no administration in time range)  senna (SENOKOT) tablet 8.6 mg (8.6 mg Oral Not Given 02/20/21 1001)  polyethylene glycol (MIRALAX / GLYCOLAX) packet 17 g (has no administration in time range)  ondansetron (ZOFRAN-ODT) disintegrating tablet 4 mg ( Oral See Alternative 02/20/21 0856)    Or  ondansetron (ZOFRAN) injection 4 mg (4 mg Intravenous Given 02/20/21 0856)  prochlorperazine (COMPAZINE) tablet 10 mg (has no administration in time range)    Or  prochlorperazine (COMPAZINE) injection 5-10 mg (has no administration in time range)  simethicone (MYLICON) chewable tablet 40 mg (has no administration in time range)  piperacillin-tazobactam (ZOSYN) IVPB 3.375 g (0 g  Intravenous Stopped 02/19/21 2120)  morphine 4 MG/ML injection  4 mg (4 mg Intravenous Given 02/19/21 2025)  ondansetron (ZOFRAN) injection 4 mg (4 mg Intravenous Given 02/19/21 2025)  morphine 4 MG/ML injection 4 mg (4 mg Intravenous Given 02/19/21 2213)    ED Course  I have reviewed the triage vital signs and the nursing notes.  Pertinent labs & imaging results that were available during my care of the patient were reviewed by me and considered in my medical decision making (see chart for details).    MDM Rules/Calculators/A&P                          54yo female presents with concern for abdominal pain.  DDx includes appendicitis, pancreatitis, cholecystitis, pyelonephritis, nephrolithiasis, diverticulitis, PID, ovarian torsion, ectopic pregnancy, and tuboovarian abscess   Labs without pancreatitis and with normal transaminases. US shows concern for cholecystitis which is consistent with history and exam. Given abx, consulted Dr. Donell Beers of General Surgery and admitted for further surgical care.    Final Clinical Impression(s) / ED Diagnoses Final diagnoses:  RUQ abdominal pain  Cholecystitis, acute    Rx / DC Orders ED Discharge Orders    None       Alvira Monday, MD 02/20/21 1153

## 2021-02-19 NOTE — ED Triage Notes (Signed)
Pt with c/o epigastric pain the radiates RUQ and in the back. Pt  States she is vomiting green bile.

## 2021-02-19 NOTE — H&P (Signed)
Cathy Kirby is an 55 y.o. female.   Chief Complaint: abdominal pain HPI:  Pt is a 55 yo F who awoke with pain at 6 am 5/22.  She has never had pain like this before.  She has had nausea and vomiting.  She denies fever/chills.    Past Medical History:  Diagnosis Date  . Endometrial polyp   . History of ectopic pregnancy    1993  righe side-- treated w/ methotraxate  . Menorrhagia   . Osteoarthritis    hands, feet  . PONV (postoperative nausea and vomiting)   . Wears glasses     Past Surgical History:  Procedure Laterality Date  . CESAREAN SECTION  1992;  05-30-2002  . DILATATION & CURETTAGE/HYSTEROSCOPY WITH MYOSURE N/A 10/19/2016   Procedure: DILATATION & CURETTAGE/HYSTEROSCOPY WITH MYOSURE;  Surgeon: Dara Lords, MD;  Location: [facility];  Service: Gynecology;  Laterality: N/A;  requesting 7:30am start time  Requests one hour OR time.  Marland Kitchen FOOT SURGERY Right 12/2012   joint fusion  . LAPAROTOMY FOR LSO FOR CYST AND TORSION  1982  . PELVIC LAPAROSCOPY  02/05/2001   DR. CILIBERTI--- FOR INFERTILITY  . TONSILLECTOMY      Family History  Problem Relation Age of Onset  . Diabetes Father   . Cancer Father        lung  . Arthritis Maternal Grandmother    Social History:  reports that she has never smoked. She has never used smokeless tobacco. She reports current alcohol use. She reports that she does not use drugs.  Allergies: No Known Allergies  Meds: prozac advil pepcis Vit d Mvt Clobetasol cream  Results for orders placed or performed during the hospital encounter of 02/19/21 (from the past 48 hour(s))  Lipase, blood     Status: Abnormal   Collection Time: 02/19/21  4:14 PM  Result Value Ref Range   Lipase <10 (L) 11 - 51 U/L    Comment: Performed at [facility], [address] [place], [place] [address]  Comprehensive metabolic panel     Status: Abnormal   Collection Time: 02/19/21  4:14 PM  Result Value Ref  Range   Sodium 139 135 - 145 mmol/L   Potassium 4.1 3.5 - 5.1 mmol/L   Chloride 106 98 - 111 mmol/L   CO2 22 22 - 32 mmol/L   Glucose, Bld 122 (H) 70 - 99 mg/dL    Comment: Glucose reference range applies only to samples taken after fasting for at least 8 hours.   BUN 16 6 - 20 mg/dL   Creatinine, Ser 1.91 0.44 - 1.00 mg/dL   Calcium 9.4 8.9 - 47.8 mg/dL   Total Protein 7.7 6.5 - 8.1 g/dL   Albumin 4.3 3.5 - 5.0 g/dL   AST 17 15 - 41 U/L   ALT 17 0 - 44 U/L   Alkaline Phosphatase 53 38 - 126 U/L   Total Bilirubin 0.4 0.3 - 1.2 mg/dL   GFR, Estimated >29 >56 mL/min    Comment: (NOTE) Calculated using the CKD-EPI Creatinine Equation (2021)    Anion gap 11 5 - 15    Comment: Performed at [facility], [address], [place], [place] [address]  CBC     Status: Abnormal   Collection Time: 02/19/21  4:14 PM  Result Value Ref Range   WBC 13.0 (H) 4.0 - 10.5 K/uL   RBC 5.24 (H) 3.87 - 5.11 MIL/uL   Hemoglobin 14.7 12.0 -  15.0 g/dL   HCT 73.7 10.6 - 26.9 %   MCV 81.9 80.0 - 100.0 fL   MCH 28.1 26.0 - 34.0 pg   MCHC 34.3 30.0 - 36.0 g/dL   RDW 48.5 46.2 - 70.3 %   Platelets 304 150 - 400 K/uL   nRBC 0.0 0.0 - 0.2 %    Comment: Performed at [facility], [address] [place], [place] [address]  Urinalysis, Routine w reflex microscopic     Status: Abnormal   Collection Time: 02/19/21  4:14 PM  Result Value Ref Range   Color, Urine YELLOW YELLOW   APPearance CLEAR CLEAR   Specific Gravity, Urine 1.016 1.005 - 1.030   pH 5.0 5.0 - 8.0   Glucose, UA NEGATIVE NEGATIVE mg/dL   Hgb urine dipstick NEGATIVE NEGATIVE   Bilirubin Urine NEGATIVE NEGATIVE   Ketones, ur TRACE (A) NEGATIVE mg/dL   Protein, ur NEGATIVE NEGATIVE mg/dL   Nitrite NEGATIVE NEGATIVE   Leukocytes,Ua NEGATIVE NEGATIVE    Comment: Performed at [facility], [address] [place], [place] [address]  Pregnancy, urine     Status: None   Collection Time:  02/19/21  4:14 PM  Result Value Ref Range   Preg Test, Ur NEGATIVE NEGATIVE    Comment:        THE SENSITIVITY OF THIS METHODOLOGY IS >20 mIU/mL. Performed at [facility], [address], [place], [place] [address]    US Abdomen Limited RUQ (LIVER/GB)  Result Date: 02/19/2021 CLINICAL DATA:  Right upper quadrant and epigastric pain for several days. Nausea and vomiting. EXAM: ULTRASOUND ABDOMEN LIMITED RIGHT UPPER QUADRANT COMPARISON:  None. FINDINGS: Gallbladder: The gallbladder is dilated measuring 16.6 x 4.6 x 4.4 cm. Multiple gallstones are seen, including a large stone in the gallbladder neck measuring 3.3 cm. No abnormal gallbladder wall thickening or pericholecystic fluid is seen, however the sonographer notes a positive sonographic Murphy sign during the exam. These findings are suspicious for acute cholecystitis. Common bile duct: Diameter: 5 mm, within normal limits. Liver: No focal lesion identified. Within normal limits in parenchymal echogenicity. Portal vein is patent on color Doppler imaging with normal direction of blood flow towards the liver. Other: 5 cm simple cyst incidentally noted in the right kidney. IMPRESSION: Findings suspicious for acute cholecystitis. No evidence of biliary ductal dilatation. Electronically Signed   By: Danae Orleans M.D.   On: 02/19/2021 19:56    Review of Systems  Constitutional: Negative.   HENT: Negative.   Eyes: Negative.   Respiratory: Negative.   Cardiovascular: Negative.   Gastrointestinal: Positive for abdominal pain and nausea.  Endocrine: Negative.   Genitourinary: Negative.   Musculoskeletal: Negative.   Skin: Negative.   Allergic/Immunologic: Negative.   Neurological: Negative.   Hematological: Negative.   Psychiatric/Behavioral: Negative.   All other systems reviewed and are negative.    Blood pressure (!) 153/91, pulse 79, temperature 98.6 F (37 C), temperature source Oral, resp. rate 20, height 5\' 2"   (1.575 m), weight 89.8 kg, last menstrual period 01/17/2021, SpO2 98 %. Physical Exam Vitals reviewed.  Constitutional:      General: She is in acute distress (looks uncomfortable).     Appearance: She is well-developed and normal weight. She is not ill-appearing or diaphoretic.  HENT:     Head: Normocephalic and atraumatic.     Mouth/Throat:     Mouth: Mucous membranes are moist.  Eyes:     General: No scleral icterus.    Extraocular  Movements: Extraocular movements intact.     Pupils: Pupils are equal, round, and reactive to light.  Cardiovascular:     Rate and Rhythm: Normal rate and regular rhythm.     Heart sounds: Normal heart sounds. No murmur heard.   Pulmonary:     Effort: Pulmonary effort is normal.     Breath sounds: Normal breath sounds. No wheezing or rhonchi.  Abdominal:     General: Abdomen is flat. Bowel sounds are decreased. There is no distension.     Palpations: Abdomen is soft. There is no hepatomegaly or splenomegaly.     Tenderness: There is abdominal tenderness in the right upper quadrant and epigastric area.     Hernia: No hernia is present.  Skin:    General: Skin is warm and dry.     Capillary Refill: Capillary refill takes 2 to 3 seconds.  Neurological:     General: No focal deficit present.     Mental Status: She is alert and oriented to person, place, and time.     Cranial Nerves: No cranial nerve deficit.     Motor: No weakness.  Psychiatric:        Mood and Affect: Mood normal. Mood is not anxious or depressed.        Behavior: Behavior normal.       Assessment/Plan Acute cholecystitis with stones  IV fluids NPO Pain control  The surgical procedure was described to the patient in detail.  The patient was given educational material.  I discussed the incision type and location, the location of the gallbladder, the anatomy of the bile ducts and arteries, and the typical progression of surgery.  I discussed the possibility of converting to  an open operation.  I advised of the risks of bleeding, infection, damage to other structures (such as the bile duct, intestine or liver), bile leak, need for other procedures or surgeries, and post op diarrhea/constipation.  We discussed the risk of blood clot.  We discussed the recovery period and post operative restrictions.  The patient was advised against taking blood thinners the week before surgery.       Maudry Diego, MD FACS Surgical Oncology, General Surgery, Trauma and [facility], [place] [phone] for weekday/non holidays Check [email or web address] for coverage night/weekend/holidays  Do not use SecureChat as it is not reliable for timely patient care.

## 2021-02-20 ENCOUNTER — Encounter (HOSPITAL_COMMUNITY): Payer: Self-pay

## 2021-02-20 ENCOUNTER — Encounter (HOSPITAL_COMMUNITY)
Admission: EM | Disposition: A | Discharge status: Home / Self Care | Payer: Self-pay | Source: Home / Self Care | Attending: Emergency Medicine

## 2021-02-20 ENCOUNTER — Observation Stay (HOSPITAL_COMMUNITY): Payer: BC Managed Care – PPO | Admitting: Anesthesiology

## 2021-02-20 HISTORY — PX: CHOLECYSTECTOMY: SHX55

## 2021-02-20 LAB — COMPREHENSIVE METABOLIC PANEL
AG Ratio: 1.6 (calc) (ref 1.0–2.5)
ALT: 13 U/L (ref 6–29)
ALT: 104 U/L — ABNORMAL HIGH (ref 0–44)
AST: 16 U/L (ref 10–35)
AST: 89 U/L — ABNORMAL HIGH (ref 15–41)
Albumin: 4.3 g/dL (ref 3.6–5.1)
Albumin: 3.6 g/dL (ref 3.5–5.0)
Alkaline Phosphatase: 87 U/L (ref 38–126)
Alkaline phosphatase (APISO): 52 U/L (ref 37–153)
Anion gap: 9 (ref 5–15)
BUN: 19 mg/dL (ref 7–25)
BUN: 10 mg/dL (ref 6–20)
CO2: 25 mmol/L (ref 20–32)
CO2: 27 mmol/L (ref 22–32)
Calcium: 9.8 mg/dL (ref 8.6–10.4)
Calcium: 9.1 mg/dL (ref 8.9–10.3)
Chloride: 107 mmol/L (ref 98–110)
Chloride: 103 mmol/L (ref 98–111)
Creat: 0.83 mg/dL (ref 0.50–1.05)
Creatinine, Ser: 0.92 mg/dL (ref 0.44–1.00)
GFR, Estimated: >60 mL/min (ref >60)
Globulin: 2.7 g/dL (calc) (ref 1.9–3.7)
Glucose, Bld: 86 mg/dL (ref 65–99)
Glucose, Bld: 168 mg/dL — ABNORMAL HIGH (ref 70–99)
Potassium: 4.8 mmol/L (ref 3.5–5.3)
Potassium: 4.3 mmol/L (ref 3.5–5.1)
Sodium: 141 mmol/L (ref 135–146)
Sodium: 139 mmol/L (ref 135–145)
Total Bilirubin: 0.4 mg/dL (ref 0.2–1.2)
Total Bilirubin: 0.6 mg/dL (ref 0.3–1.2)
Total Protein: 7 g/dL (ref 6.1–8.1)
Total Protein: 6.7 g/dL (ref 6.5–8.1)

## 2021-02-20 LAB — CBC
HCT: 39.9 % (ref 36.0–46.0)
HCT: 44.2 % (ref 35.0–45.0)
HCT: 40.2 % (ref 36.0–46.0)
Hemoglobin: 13.8 g/dL (ref 12.0–15.0)
Hemoglobin: 14.3 g/dL (ref 11.7–15.5)
Hemoglobin: 13.8 g/dL (ref 12.0–15.0)
MCH: 28.3 pg (ref 26.0–34.0)
MCH: 28.5 pg (ref 27.0–33.0)
MCH: 28.4 pg (ref 26.0–34.0)
MCHC: 34.6 g/dL (ref 30.0–36.0)
MCHC: 32.4 g/dL (ref 32.0–36.0)
MCHC: 34.3 g/dL (ref 30.0–36.0)
MCV: 81.8 fL (ref 80.0–100.0)
MCV: 88.2 fL (ref 80.0–100.0)
MCV: 82.7 fL (ref 80.0–100.0)
MPV: 11.6 fL (ref 7.5–12.5)
Platelets: 245 10*3/uL (ref 150–400)
Platelets: 283 10*3/uL (ref 140–400)
Platelets: 246 10*3/uL (ref 150–400)
RBC: 4.88 MIL/uL (ref 3.87–5.11)
RBC: 5.01 10*6/uL (ref 3.80–5.10)
RBC: 4.86 MIL/uL (ref 3.87–5.11)
RDW: 14 % (ref 11.5–15.5)
RDW: 13.3 % (ref 11.0–15.0)
RDW: 14.3 % (ref 11.5–15.5)
WBC: 14.3 10*3/uL — ABNORMAL HIGH (ref 4.0–10.5)
WBC: 6.8 10*3/uL (ref 3.8–10.8)
WBC: 14.6 10*3/uL — ABNORMAL HIGH (ref 4.0–10.5)
nRBC: 0 % (ref 0.0–0.2)
nRBC: 0 % (ref 0.0–0.2)

## 2021-02-20 LAB — LIPID PANEL
Cholesterol: 270 mg/dL — ABNORMAL HIGH (ref <200)
HDL: 57 mg/dL (ref >=50)
LDL Cholesterol (Calc): 184 mg/dL (calc) — ABNORMAL HIGH
Non-HDL Cholesterol (Calc): 213 mg/dL (calc) — ABNORMAL HIGH (ref <130)
Total CHOL/HDL Ratio: 4.7 (calc) (ref <5.0)
Triglycerides: 150 mg/dL — ABNORMAL HIGH (ref <150)

## 2021-02-20 LAB — PROTIME-INR
INR: 1 (ref 0.8–1.2)
Prothrombin Time: 13.6 seconds (ref 11.4–15.2)

## 2021-02-20 LAB — CREATININE, SERUM
Creatinine, Ser: 0.93 mg/dL (ref 0.44–1.00)
GFR, Estimated: >60 mL/min (ref >60)

## 2021-02-20 LAB — SURGICAL PCR SCREEN
MRSA, PCR: NEGATIVE
Staphylococcus aureus: NEGATIVE

## 2021-02-20 LAB — TSH: TSH: 2.03 mIU/L

## 2021-02-20 LAB — VITAMIN D 1,25 DIHYDROXY
Vitamin D 1, 25 (OH)2 Total: 38 pg/mL (ref 18–72)
Vitamin D2 1, 25 (OH)2: <8 pg/mL
Vitamin D3 1, 25 (OH)2: 38 pg/mL

## 2021-02-20 LAB — HIV ANTIBODY (ROUTINE TESTING W REFLEX): HIV Screen 4th Generation wRfx: NONREACTIVE

## 2021-02-20 SURGERY — LAPAROSCOPIC CHOLECYSTECTOMY
Anesthesia: General | Site: Abdomen

## 2021-02-20 MED ORDER — SUGAMMADEX SODIUM 200 MG/2ML IV SOLN
INTRAVENOUS | Status: DC | PRN
  Administered 2021-02-20: 200 mg via INTRAVENOUS

## 2021-02-20 MED ORDER — LACTATED RINGERS IV SOLN: INTRAVENOUS | Status: DC

## 2021-02-20 MED ORDER — PROPOFOL 10 MG/ML IV BOLUS
INTRAVENOUS | Status: DC | PRN
  Administered 2021-02-20: 140 mg via INTRAVENOUS

## 2021-02-20 MED ORDER — CEFAZOLIN SODIUM-DEXTROSE 2-3 GM-%(50ML) IV SOLR
INTRAVENOUS | Status: DC | PRN
  Administered 2021-02-20: 2 g via INTRAVENOUS

## 2021-02-20 MED ORDER — ONDANSETRON HCL 4 MG/2ML IJ SOLN
INTRAMUSCULAR | Status: AC
  Filled 2021-02-20: qty 2

## 2021-02-20 MED ORDER — FENTANYL CITRATE (PF) 100 MCG/2ML IJ SOLN
25.0000 ug | INTRAMUSCULAR | Status: DC | PRN
  Administered 2021-02-20 (×2): 50 ug via INTRAVENOUS

## 2021-02-20 MED ORDER — ORAL CARE MOUTH RINSE
15.0000 mL | Freq: Once | OROMUCOSAL | Status: AC
Start: 2021-02-20 — End: 2021-02-20

## 2021-02-20 MED ORDER — DEXAMETHASONE SODIUM PHOSPHATE 10 MG/ML IJ SOLN
INTRAMUSCULAR | Status: AC
  Filled 2021-02-20: qty 1

## 2021-02-20 MED ORDER — 0.9 % SODIUM CHLORIDE (POUR BTL) OPTIME
TOPICAL | Status: DC | PRN
  Administered 2021-02-20: 1000 mL

## 2021-02-20 MED ORDER — ROCURONIUM BROMIDE 10 MG/ML (PF) SYRINGE
PREFILLED_SYRINGE | INTRAVENOUS | Status: DC | PRN
  Administered 2021-02-20: 10 mg via INTRAVENOUS
  Administered 2021-02-20: 50 mg via INTRAVENOUS

## 2021-02-20 MED ORDER — ONDANSETRON HCL 4 MG/2ML IJ SOLN
INTRAMUSCULAR | Status: DC | PRN
  Administered 2021-02-20: 4 mg via INTRAVENOUS

## 2021-02-20 MED ORDER — CHLORHEXIDINE GLUCONATE 0.12 % MT SOLN
15.0000 mL | Freq: Once | OROMUCOSAL | Status: AC
  Administered 2021-02-20: 15 mL via OROMUCOSAL
  Filled 2021-02-20: qty 15

## 2021-02-20 MED ORDER — DEXAMETHASONE SODIUM PHOSPHATE 10 MG/ML IJ SOLN
INTRAMUSCULAR | Status: DC | PRN
  Administered 2021-02-20: 8 mg via INTRAVENOUS

## 2021-02-20 MED ORDER — BUPIVACAINE-EPINEPHRINE (PF) 0.25% -1:200000 IJ SOLN
INTRAMUSCULAR | Status: AC
  Filled 2021-02-20: qty 30

## 2021-02-20 MED ORDER — SCOPOLAMINE 1 MG/3DAYS TD PT72
MEDICATED_PATCH | TRANSDERMAL | Status: AC
  Filled 2021-02-20: qty 1

## 2021-02-20 MED ORDER — ACETAMINOPHEN 500 MG PO TABS
1000.0000 mg | ORAL_TABLET | Freq: Once | ORAL | Status: AC
  Administered 2021-02-20: 1000 mg via ORAL
  Filled 2021-02-20: qty 2

## 2021-02-20 MED ORDER — PHENYLEPHRINE 40 MCG/ML (10ML) SYRINGE FOR IV PUSH (FOR BLOOD PRESSURE SUPPORT)
PREFILLED_SYRINGE | INTRAVENOUS | Status: DC | PRN
  Administered 2021-02-20: 80 ug via INTRAVENOUS
  Administered 2021-02-20: 120 ug via INTRAVENOUS

## 2021-02-20 MED ORDER — SODIUM CHLORIDE 0.9 % IV SOLN
2.0000 g | Freq: Every day | INTRAVENOUS | Status: AC
  Administered 2021-02-20: 2 g via INTRAVENOUS
  Filled 2021-02-20: qty 20

## 2021-02-20 MED ORDER — SODIUM CHLORIDE 0.9 % IR SOLN
Status: DC | PRN
  Administered 2021-02-20: 1000 mL

## 2021-02-20 MED ORDER — APREPITANT 40 MG PO CAPS
ORAL_CAPSULE | ORAL | Status: AC
  Filled 2021-02-20: qty 1

## 2021-02-20 MED ORDER — FENTANYL CITRATE (PF) 250 MCG/5ML IJ SOLN
INTRAMUSCULAR | Status: AC
  Filled 2021-02-20: qty 5

## 2021-02-20 MED ORDER — CEFAZOLIN SODIUM 1 G IJ SOLR
INTRAMUSCULAR | Status: AC
  Filled 2021-02-20: qty 20

## 2021-02-20 MED ORDER — LIDOCAINE 2% (20 MG/ML) 5 ML SYRINGE
INTRAMUSCULAR | Status: AC
  Filled 2021-02-20: qty 5

## 2021-02-20 MED ORDER — INDOCYANINE GREEN 25 MG IV SOLR
INTRAVENOUS | Status: DC | PRN
  Administered 2021-02-20: 7.5 mg via INTRAVENOUS

## 2021-02-20 MED ORDER — MIDAZOLAM HCL 2 MG/2ML IJ SOLN
INTRAMUSCULAR | Status: DC | PRN
  Administered 2021-02-20: 2 mg via INTRAVENOUS

## 2021-02-20 MED ORDER — MIDAZOLAM HCL 2 MG/2ML IJ SOLN
INTRAMUSCULAR | Status: AC
  Filled 2021-02-20: qty 2

## 2021-02-20 MED ORDER — ROCURONIUM BROMIDE 10 MG/ML (PF) SYRINGE
PREFILLED_SYRINGE | INTRAVENOUS | Status: AC
  Filled 2021-02-20: qty 10

## 2021-02-20 MED ORDER — FENTANYL CITRATE (PF) 100 MCG/2ML IJ SOLN
INTRAMUSCULAR | Status: AC
  Filled 2021-02-20: qty 2

## 2021-02-20 MED ORDER — SCOPOLAMINE 1 MG/3DAYS TD PT72
1.0000 | MEDICATED_PATCH | TRANSDERMAL | Status: DC
  Administered 2021-02-20: 1.5 mg via TRANSDERMAL

## 2021-02-20 MED ORDER — FENTANYL CITRATE (PF) 100 MCG/2ML IJ SOLN
INTRAMUSCULAR | Status: DC | PRN
  Administered 2021-02-20: 50 ug via INTRAVENOUS
  Administered 2021-02-20 (×2): 25 ug via INTRAVENOUS
  Administered 2021-02-20 (×2): 50 ug via INTRAVENOUS

## 2021-02-20 MED ORDER — HEMOSTATIC AGENTS (NO CHARGE) OPTIME
TOPICAL | Status: DC | PRN
  Administered 2021-02-20: 1 via TOPICAL

## 2021-02-20 MED ORDER — LIDOCAINE 2% (20 MG/ML) 5 ML SYRINGE
INTRAMUSCULAR | Status: DC | PRN
  Administered 2021-02-20: 40 mg via INTRAVENOUS

## 2021-02-20 MED ORDER — APREPITANT 40 MG PO CAPS
40.0000 mg | ORAL_CAPSULE | Freq: Once | ORAL | Status: AC
  Administered 2021-02-20: 40 mg via ORAL

## 2021-02-20 MED ORDER — BUPIVACAINE-EPINEPHRINE 0.25% -1:200000 IJ SOLN
INTRAMUSCULAR | Status: DC | PRN
  Administered 2021-02-20: 8 mL

## 2021-02-20 SURGICAL SUPPLY — 42 items
ADH SKN CLS APL DERMABOND .7 (GAUZE/BANDAGES/DRESSINGS) ×1
APL PRP STRL LF DISP 70% ISPRP (MISCELLANEOUS) ×1
APPLIER CLIP 5 13 M/L LIGAMAX5 (MISCELLANEOUS) ×2
APR CLP MED LRG 5 ANG JAW (MISCELLANEOUS) ×1
BAG SPEC RTRVL 10 TROC 200 (ENDOMECHANICALS) ×1
CANISTER SUCT 3000ML PPV (MISCELLANEOUS) ×2 IMPLANT
CHLORAPREP W/TINT 26 (MISCELLANEOUS) ×2 IMPLANT
CLIP APPLIE 5 13 M/L LIGAMAX5 (MISCELLANEOUS) ×1 IMPLANT
COVER SURGICAL LIGHT HANDLE (MISCELLANEOUS) ×2 IMPLANT
COVER WAND RF STERILE (DRAPES) ×2 IMPLANT
DERMABOND ADVANCED (GAUZE/BANDAGES/DRESSINGS) ×1
DERMABOND ADVANCED .7 DNX12 (GAUZE/BANDAGES/DRESSINGS) ×1 IMPLANT
ELECT REM PT RETURN 9FT ADLT (ELECTROSURGICAL) ×2
ELECTRODE REM PT RTRN 9FT ADLT (ELECTROSURGICAL) ×1 IMPLANT
GLOVE BIO SURGEON STRL SZ7 (GLOVE) ×2 IMPLANT
GLOVE BIOGEL PI IND STRL 7.5 (GLOVE) ×1 IMPLANT
GLOVE BIOGEL PI INDICATOR 7.5 (GLOVE) ×1
GOWN STRL REUS W/ TWL LRG LVL3 (GOWN DISPOSABLE) ×3 IMPLANT
GOWN STRL REUS W/TWL LRG LVL3 (GOWN DISPOSABLE) ×6
GRASPER SUT TROCAR 14GX15 (MISCELLANEOUS) ×2 IMPLANT
HEMOSTAT SNOW SURGICEL 2X4 (HEMOSTASIS) ×1 IMPLANT
KIT BASIN OR (CUSTOM PROCEDURE TRAY) ×2 IMPLANT
KIT IMAGING PINPOINTPAQ (MISCELLANEOUS) ×1 IMPLANT
KIT TURNOVER KIT B (KITS) ×2 IMPLANT
NS IRRIG 1000ML POUR BTL (IV SOLUTION) ×2 IMPLANT
PAD ARMBOARD 7.5X6 YLW CONV (MISCELLANEOUS) ×2 IMPLANT
POUCH RETRIEVAL ECOSAC 10 (ENDOMECHANICALS) ×1 IMPLANT
POUCH RETRIEVAL ECOSAC 10MM (ENDOMECHANICALS) ×2
SCISSORS LAP 5X35 DISP (ENDOMECHANICALS) ×2 IMPLANT
SET IRRIG TUBING LAPAROSCOPIC (IRRIGATION / IRRIGATOR) ×2 IMPLANT
SET TUBE SMOKE EVAC HIGH FLOW (TUBING) ×2 IMPLANT
SLEEVE ENDOPATH XCEL 5M (ENDOMECHANICALS) ×4 IMPLANT
SPECIMEN JAR SMALL (MISCELLANEOUS) ×2 IMPLANT
STRIP CLOSURE SKIN 1/2X4 (GAUZE/BANDAGES/DRESSINGS) ×2 IMPLANT
SUT MNCRL AB 4-0 PS2 18 (SUTURE) ×2 IMPLANT
SUT VICRYL 0 UR6 27IN ABS (SUTURE) ×2 IMPLANT
TOWEL GREEN STERILE (TOWEL DISPOSABLE) ×2 IMPLANT
TOWEL GREEN STERILE FF (TOWEL DISPOSABLE) ×2 IMPLANT
TRAY LAPAROSCOPIC MC (CUSTOM PROCEDURE TRAY) ×2 IMPLANT
TROCAR XCEL BLUNT TIP 100MML (ENDOMECHANICALS) ×2 IMPLANT
TROCAR XCEL NON-BLD 5MMX100MML (ENDOMECHANICALS) ×2 IMPLANT
WATER STERILE IRR 1000ML POUR (IV SOLUTION) ×2 IMPLANT

## 2021-02-20 NOTE — Anesthesia Postprocedure Evaluation (Signed)
Anesthesia Post Note  Patient: Cathy Kirby  Procedure(s) Performed: LAPAROSCOPIC CHOLECYSTECTOMY (N/A Abdomen)     Patient location during evaluation: PACU Anesthesia Type: General Level of consciousness: awake and alert Pain management: pain level controlled Vital Signs Assessment: post-procedure vital signs reviewed and stable Respiratory status: spontaneous breathing, nonlabored ventilation, respiratory function stable and patient connected to nasal cannula oxygen Cardiovascular status: blood pressure returned to baseline and stable Postop Assessment: no apparent nausea or vomiting Anesthetic complications: no   No complications documented.  Last Vitals:  Vitals:   02/20/21 1604 02/20/21 1630  BP: 105/72 104/62  Pulse: 82 75  Resp: 11 18  Temp: 36.7 C 36.8 C  SpO2: 94% 91%    Last Pain:  Vitals:   02/20/21 1630  TempSrc: Oral  PainSc:                  [name]

## 2021-02-20 NOTE — Op Note (Signed)
Preoperative diagnosis: acute cholecystitis Postoperative diagnosis: acute cholecystitis Procedure: Laparoscopic cholecystectomy Surgeon: Dr. Harden Mo Assistant: Barnetta Chapel, PA-C Anesthesia: General Estimated blood loss: 20 cc Specimens: Gallbladder and contents to pathology Complications: None Drains: None Sponge and count was correct completion Decision to recovery stable condition  Indications: 40 yof with ruq pain, elevated wbc and [place] with hydrops of gallbladder consistent with cholecystitis. We discussed lap chole.   Procedure: After informed consent was obtained the patient wastaken to the OR.She was given antibiotics. SCDs were in place. She was placed under general anesthesia without complication. She was prepped and draped in the standard sterile surgical fashion. Surgical timeout was then performed.  I infiltrated Marcaine and made a curvilinear incision below the umbilicus. I then incised the fascia and entered the peritoneum bluntly. I then placed a 0 Vicryl pursestring suture around the fascia. I inserted a Hassan trocar and insufflated the abdomen to 15 mmHg pressure. I then inserted 3 additional 5 mm trocars in the epigastrium and right upper quadrant under direct vision without complication. The gallbladder was tense and adherent to the omentum. Just to grasp it I aspirated it and there was purulence present.  I then had to with a combination of blunt and sharp dissection was able to free the gallbladder from the omentum and the duodenum.  It was then retracted cephalad and lateral.  Eventually I was able to dissect the triangle and clearly obtain the critical view of safety.I then clipped the artery 3 times and divided it leaving aclip in place. I then treated the duct in a similar fashion. The clips completely traversed the duct and the duct was viable. I then removed the gallbladder with some difficulty from the liver bed as it was very adherent. I  then placed the gallbladder in a retrieval bag. This was removed from the umbilical port site. I obtained hemostasis. The liver was very friable. I did place a piece of Surgicel snow in it at completion. I had irrigated until this was clear. I then removed my Hassan trocar and tied my pursestring down.  I then placed 2 additional 0 vicryl sutures with the suture passer device as there was a small preexisting hernia at this site.   The abdomen was then desufflated and the remaining trocars removed. I then closed these with 4-0 Monocryl. Glue and Steri-Strips were applied. She tolerated this well was extubated and transferred to recovery stable.

## 2021-02-20 NOTE — Anesthesia Preprocedure Evaluation (Addendum)
Anesthesia Evaluation  Patient identified by MRN, date of birth, ID band Patient awake    Reviewed: Allergy & Precautions, NPO status , Patient's Chart, lab work & pertinent test results  History of Anesthesia Complications (+) PONV  Airway Mallampati: I  TM Distance: >3 FB Neck ROM: Full    Dental no notable dental hx. (+) Teeth Intact, Dental Advisory Given   Pulmonary neg pulmonary ROS,    Pulmonary exam normal breath sounds clear to auscultation       Cardiovascular negative cardio ROS Normal cardiovascular exam Rhythm:Regular Rate:Normal     Neuro/Psych PSYCHIATRIC DISORDERS Depression negative neurological ROS     GI/Hepatic negative GI ROS, Neg liver ROS,   Endo/Other  negative endocrine ROSObese BMI 36  Renal/GU negative Renal ROS  negative genitourinary   Musculoskeletal  (+) Arthritis ,   Abdominal   Peds  Hematology negative hematology ROS (+)   Anesthesia Other Findings   Reproductive/Obstetrics                            Anesthesia Physical Anesthesia Plan  ASA: II  Anesthesia Plan: General   Post-op Pain Management:    Induction: Intravenous  PONV Risk Score and Plan: 4 or greater and Midazolam, Dexamethasone, Ondansetron, Scopolamine patch - Pre-op and Aprepitant  Airway Management Planned: Oral ETT  Additional Equipment:   Intra-op Plan:   Post-operative Plan: Extubation in OR  Informed Consent: I have reviewed the patients History and Physical, chart, labs and discussed the procedure including the risks, benefits and alternatives for the proposed anesthesia with the patient or authorized representative who has indicated his/her understanding and acceptance.     Dental advisory given  Plan Discussed with: CRNA  Anesthesia Plan Comments:        Anesthesia Quick Evaluation

## 2021-02-20 NOTE — Transfer of Care (Signed)
Immediate Anesthesia Transfer of Care Note  Patient: Cathy Kirby  Procedure(s) Performed: LAPAROSCOPIC CHOLECYSTECTOMY (N/A Abdomen)  Patient Location: PACU  Anesthesia Type:General  Level of Consciousness: awake and oriented  Airway & Oxygen Therapy: Patient Spontanous Breathing and Patient connected to face mask oxygen  Post-op Assessment: Report given to RN  Post vital signs: Reviewed and stable  Last Vitals:  Vitals Value Taken Time  BP 119/71 02/20/21 1519  Temp 36.8 C 02/20/21 1519  Pulse 89 02/20/21 1520  Resp 19 02/20/21 1520  SpO2 99 % 02/20/21 1520  Vitals shown include unvalidated device data.  Last Pain:  Vitals:   02/20/21 1304  TempSrc: Oral  PainSc:       Patients Stated Pain Goal: 2 (02/20/21 0848)  Complications: No complications documented.

## 2021-02-20 NOTE — Interval H&P Note (Signed)
History and Physical Interval Note:  02/20/2021 8:40 AM I have seen and examined patient. She has cholecystitis on [place] and on exam with gallstones.  She has murphys sign. Lfts and lipase nl . Hx of gyn laparoscopies.  Plan lap chole today I discussed the procedure in detail.  We discussed the risks and benefits of a laparoscopic cholecystectomy and possible cholangiogram including, but not limited to bleeding, infection, injury to surrounding structures such as the intestine or liver, bile leak, retained gallstones, need to convert to an open procedure, prolonged diarrhea, blood clots such as  DVT, common bile duct injury, anesthesia risks, and possible need for additional procedures.  The likelihood of improvement in symptoms and return to the patient's normal status is good. We discussed the typical post-operative recovery course.  Cathy Kirby  has presented today for surgery, with the diagnosis of Cholecystitis.  The various methods of treatment have been discussed with the patient and family. After consideration of risks, benefits and other options for treatment, the patient has consented to  Procedure(s): LAPAROSCOPIC CHOLECYSTECTOMY (N/A) as a surgical intervention.  The patient's history has been reviewed, patient examined, no change in status, stable for surgery.  I have reviewed the patient's chart and labs.  Questions were answered to the patient's satisfaction.     Emelia Loron

## 2021-02-20 NOTE — Anesthesia Procedure Notes (Signed)
Procedure Name: Intubation Date/Time: 02/20/2021 2:14 PM Performed by: Barrington Ellison, CRNA Pre-anesthesia Checklist: Patient identified, Emergency Drugs available, Suction available and Patient being monitored Patient Re-evaluated:Patient Re-evaluated prior to induction Oxygen Delivery Method: Circle System Utilized Preoxygenation: Pre-oxygenation with 100% oxygen Induction Type: IV induction Ventilation: Mask ventilation without difficulty Laryngoscope Size: Mac and 3 Grade View: Grade I Tube type: Oral Tube size: 7.0 mm Number of attempts: 1 Airway Equipment and Method: Stylet and Oral airway Placement Confirmation: ETT inserted through vocal cords under direct vision,  positive ETCO2 and breath sounds checked- equal and bilateral Secured at: 21 cm Tube secured with: Tape Dental Injury: Teeth and Oropharynx as per pre-operative assessment  Comments: Intubation performed by Hart Carwin, EMT student under direct supervision by MD and CRNA

## 2021-02-20 NOTE — Discharge Instructions (Signed)
[facility] LAPAROSCOPIC SURGERY: POST OP INSTRUCTIONS Always review your discharge instruction sheet given to you by the facility where your surgery was performed. IF YOU HAVE DISABILITY OR FAMILY LEAVE FORMS, YOU MUST BRING THEM TO THE OFFICE FOR PROCESSING.   DO NOT GIVE THEM TO YOUR DOCTOR.  PAIN CONTROL  1. First take acetaminophen (Tylenol) AND/or ibuprofen (Advil) to control your pain after surgery.  Follow directions on package.  Taking acetaminophen (Tylenol) and/or ibuprofen (Advil) regularly after surgery will help to control your pain and lower the amount of prescription pain medication you may need.  You should not take more than 3,000 mg (3 grams) of acetaminophen (Tylenol) in 24 hours.  You should not take ibuprofen (Advil), aleve, motrin, naprosyn or other NSAIDS if you have a history of stomach ulcers or chronic kidney disease.  2. A prescription for pain medication may be given to you upon discharge.  Take your pain medication as prescribed, if you still have uncontrolled pain after taking acetaminophen (Tylenol) or ibuprofen (Advil). 3. Use ice packs to help control pain. 4. If you need a refill on your pain medication, please contact your pharmacy.  They will contact our office to request authorization. Prescriptions will not be filled after 5pm or on week-ends.  HOME MEDICATIONS 5. Take your usually prescribed medications unless otherwise directed.  DIET 6. You should follow a light diet the first few days after arrival home.  Be sure to include lots of fluids daily. Avoid fatty, fried foods.   CONSTIPATION 7. It is common to experience some constipation after surgery and if you are taking pain medication.  Increasing fluid intake and taking a stool softener (such as Colace) will usually help or prevent this problem from occurring.  A mild laxative (Milk of Magnesia or Miralax) should be taken according to package instructions if there are no bowel  movements after 48 hours.  WOUND/INCISION CARE 8. Most patients will experience some swelling and bruising in the area of the incisions.  Ice packs will help.  Swelling and bruising can take several days to resolve.  9. Unless discharge instructions indicate otherwise, follow guidelines below  a. STERI-STRIPS - you may remove your outer bandages 48 hours after surgery, and you may shower at that time.  You have steri-strips (small skin tapes) in place directly over the incision.  These strips should be left on the skin for 7-10 days.   b. DERMABOND/SKIN GLUE - you may shower in 24 hours.  The glue will flake off over the next 2-3 weeks. 10. Any sutures or staples will be removed at the office during your follow-up visit.  ACTIVITIES 11. You may resume regular (light) daily activities beginning the next day--such as daily self-care, walking, climbing stairs--gradually increasing activities as tolerated.  You may have sexual intercourse when it is comfortable.  Refrain from any heavy lifting or straining until approved by your doctor. a. You may drive when you are no longer taking prescription pain medication, you can comfortably wear a seatbelt, and you can safely maneuver your car and apply brakes.  FOLLOW-UP 12. You should see your doctor in the office for a follow-up appointment approximately 2-3 weeks after your surgery.  You should have been given your post-op/follow-up appointment when your surgery was scheduled.  If you did not receive a post-op/follow-up appointment, make sure that you call for this appointment within a day or two after you arrive home to insure a convenient appointment time.     WHEN TO CALL YOUR DOCTOR: 1. Fever over 101.0 2. Inability to urinate 3. Continued bleeding from incision. 4. Increased pain, redness, or drainage from the incision. 5. Increasing abdominal pain  The clinic staff is available to answer your questions during regular business hours.  Please don't  hesitate to call and ask to speak to one of the nurses for clinical concerns.  If you have a medical emergency, go to the nearest emergency room or call 911.  A surgeon from [facility] is always on call at the hospital. [address], [address], [place], [place]  [address] ? [address] [address], , [place]   [address] [phone] ? [phone] ? FAX [phone] Web site: [email or web address]  .........   Managing Your Pain After Surgery Without Opioids    Thank you for participating in our program to help patients manage their pain after surgery without opioids. This is part of our effort to provide you with the best care possible, without exposing you or your family to the risk that opioids pose.  What pain can I expect after surgery? You can expect to have some pain after surgery. This is normal. The pain is typically worse the day after surgery, and quickly begins to get better. Many studies have found that many patients are able to manage their pain after surgery with Over-the-Counter (OTC) medications such as Tylenol and Motrin. If you have a condition that does not allow you to take Tylenol or Motrin, notify your surgical team.  How will I manage my pain? The best strategy for controlling your pain after surgery is around the clock pain control with Tylenol (acetaminophen) and Motrin (ibuprofen or Advil). Alternating these medications with each other allows you to maximize your pain control. In addition to Tylenol and Motrin, you can use heating pads or ice packs on your incisions to help reduce your pain.  How will I alternate your regular strength over-the-counter pain medication? You will take a dose of pain medication every three hours. ; Start by taking 650 mg of Tylenol (2 pills of 325 mg) ; 3 hours later take 600 mg of Motrin (3 pills of 200 mg) ; 3 hours after taking the Motrin take 650 mg of Tylenol ; 3 hours after that take 600 mg of  Motrin.   - 1 -  See example - if your first dose of Tylenol is at 12:00 PM   12:00 PM Tylenol 650 mg (2 pills of 325 mg)  3:00 PM Motrin 600 mg (3 pills of 200 mg)  6:00 PM Tylenol 650 mg (2 pills of 325 mg)  9:00 PM Motrin 600 mg (3 pills of 200 mg)  Continue alternating every 3 hours   We recommend that you follow this schedule around-the-clock for at least 3 days after surgery, or until you feel that it is no longer needed. Use the table on the last page of this handout to keep track of the medications you are taking. Important: Do not take more than 3000mg of Tylenol or 3200mg of Motrin in a 24-hour period. Do not take ibuprofen/Motrin if you have a history of bleeding stomach ulcers, severe kidney disease, &/or actively taking a blood thinner  What if I still have pain? If you have pain that is not controlled with the over-the-counter pain medications (Tylenol and Motrin or Advil) you might have what we call "breakthrough" pain. You will receive a prescription for a small amount of an opioid pain medication such as   Oxycodone, Tramadol, or Tylenol with Codeine. Use these opioid pills in the first 24 hours after surgery if you have breakthrough pain. Do not take more than 1 pill every 4-6 hours.  If you still have uncontrolled pain after using all opioid pills, don't hesitate to call our staff using the number provided. We will help make sure you are managing your pain in the best way possible, and if necessary, we can provide a prescription for additional pain medication.   Day 1    Time  Name of Medication Number of pills taken  Amount of Acetaminophen  Pain Level   Comments  AM PM       AM PM       AM PM       AM PM       AM PM       AM PM       AM PM       AM PM       Total Daily amount of Acetaminophen Do not take more than  3,000 mg per day      Day 2    Time  Name of Medication Number of pills taken  Amount of Acetaminophen  Pain Level   Comments  AM  PM       AM PM       AM PM       AM PM       AM PM       AM PM       AM PM       AM PM       Total Daily amount of Acetaminophen Do not take more than  3,000 mg per day      Day 3    Time  Name of Medication Number of pills taken  Amount of Acetaminophen  Pain Level   Comments  AM PM       AM PM       AM PM       AM PM          AM PM       AM PM       AM PM       AM PM       Total Daily amount of Acetaminophen Do not take more than  3,000 mg per day      Day 4    Time  Name of Medication Number of pills taken  Amount of Acetaminophen  Pain Level   Comments  AM PM       AM PM       AM PM       AM PM       AM PM       AM PM       AM PM       AM PM       Total Daily amount of Acetaminophen Do not take more than  3,000 mg per day      Day 5    Time  Name of Medication Number of pills taken  Amount of Acetaminophen  Pain Level   Comments  AM PM       AM PM       AM PM       AM PM       AM PM       AM PM       AM PM         AM PM       Total Daily amount of Acetaminophen Do not take more than  3,000 mg per day       Day 6    Time  Name of Medication Number of pills taken  Amount of Acetaminophen  Pain Level  Comments  AM PM       AM PM       AM PM       AM PM       AM PM       AM PM       AM PM       AM PM       Total Daily amount of Acetaminophen Do not take more than  3,000 mg per day      Day 7    Time  Name of Medication Number of pills taken  Amount of Acetaminophen  Pain Level   Comments  AM PM       AM PM       AM PM       AM PM       AM PM       AM PM       AM PM       AM PM       Total Daily amount of Acetaminophen Do not take more than  3,000 mg per day        For additional information about how and where to safely dispose of unused opioid medications - https://www.morepowerfulnc.org  Disclaimer: This document contains information and/or instructional materials adapted from [facility]  for the typical patient with your condition. It does not replace medical advice from your health care provider because your experience may differ from that of the typical patient. Talk to your health care provider if you have any questions about this document, your condition or your treatment plan. Adapted from [facility]  

## 2021-02-21 ENCOUNTER — Encounter (HOSPITAL_COMMUNITY): Payer: Self-pay | Admitting: General Surgery

## 2021-02-21 MED ORDER — ACETAMINOPHEN 500 MG PO TABS: 500.0000 mg | ORAL_TABLET | Freq: Four times a day (QID) | ORAL | Status: AC | PRN

## 2021-02-21 MED ORDER — IBUPROFEN 600 MG PO TABS
600.0000 mg | ORAL_TABLET | Freq: Three times a day (TID) | ORAL | 0 refills | Status: AC | PRN
Start: 2021-02-21 — End: ?

## 2021-02-21 MED ORDER — OXYCODONE HCL 5 MG PO TABS: 5.0000 mg | ORAL_TABLET | Freq: Four times a day (QID) | ORAL | 0 refills | Status: DC | PRN

## 2021-02-21 NOTE — Progress Notes (Signed)
Pt refused am medications.  States will take her own medications once she is home.

## 2021-02-21 NOTE — Discharge Summary (Signed)
[facility] Discharge Summary   Patient ID: Cathy Kirby MRN: [id number] DOB/AGE: 54-Jul-1967 54 y.o.  Admit date: 02/19/2021 Discharge date: 02/21/2021  Admitting Diagnosis: Acute cholecystitis   Discharge Diagnosis S/P laparoscopic cholecystectomy   Consultants None   Imaging: US Abdomen Limited RUQ (LIVER/GB)  Result Date: 02/19/2021 CLINICAL DATA:  Right upper quadrant and epigastric pain for several days. Nausea and vomiting. EXAM: ULTRASOUND ABDOMEN LIMITED RIGHT UPPER QUADRANT COMPARISON:  None. FINDINGS: Gallbladder: The gallbladder is dilated measuring 16.6 x 4.6 x 4.4 cm. Multiple gallstones are seen, including a large stone in the gallbladder neck measuring 3.3 cm. No abnormal gallbladder wall thickening or pericholecystic fluid is seen, however the sonographer notes a positive sonographic Murphy sign during the exam. These findings are suspicious for acute cholecystitis. Common bile duct: Diameter: 5 mm, within normal limits. Liver: No focal lesion identified. Within normal limits in parenchymal echogenicity. Portal vein is patent on color Doppler imaging with normal direction of blood flow towards the liver. Other: 5 cm simple cyst incidentally noted in the right kidney. IMPRESSION: Findings suspicious for acute cholecystitis. No evidence of biliary ductal dilatation. Electronically Signed   By: Danae Orleans M.D.   On: 02/19/2021 19:56    Procedures Dr. Emelia Loron (02/20/21) - Laparoscopic Cholecystectomy  Hospital Course:  Patient is a 55 year old female who presented to [facility] with abdominal pain.  Workup showed acute cholecystitis. Patient was transferred to [facility] for surgical evaluation.  Patient was admitted and underwent procedure listed above.  Tolerated procedure well and was transferred to the floor.  Diet was advanced as tolerated.  On POD#1, the patient was voiding well, tolerating diet, ambulating well, pain well controlled,  vital signs stable, incisions c/d/i and felt stable for discharge home.  Patient will follow up in our office in 3-4 weeks and knows to call with questions or concerns. She will call to confirm appointment date/time.    Physical Exam: General:  Alert, NAD, pleasant, comfortable Abd:  Soft, ND, mild tenderness, incisions C/D/I  I or a member of my team have reviewed this patient in the Controlled Substance Database.   Allergies as of 02/21/2021   No Known Allergies     Medication List    TAKE these medications   acetaminophen 500 MG tablet Commonly known as: TYLENOL Take 1 tablet (500 mg total) by mouth every 6 (six) hours as needed for mild pain, fever or headache.   bismuth subsalicylate 262 MG chewable tablet Commonly known as: PEPTO BISMOL Chew 524 mg by mouth as needed for indigestion or diarrhea or loose stools.   clobetasol cream 0.05 % Commonly known as: TEMOVATE Apply 1 application topically daily for 14 days. Thin application to affected vulva daily x 2 weeks, then twice a week long term.   famotidine 20 MG tablet Commonly known as: PEPCID Take 20 mg by mouth 2 (two) times daily as needed for heartburn or indigestion.   fish oil-omega-3 fatty acids 1000 MG capsule Take 2 g by mouth daily.   FLUoxetine 20 MG capsule Commonly known as: PROZAC Take 1 capsule (20 mg total) by mouth daily.   ibuprofen 600 MG tablet Commonly known as: ADVIL Take 1 tablet (600 mg total) by mouth every 8 (eight) hours as needed for moderate pain or cramping. What changed: reasons to take this   multivitamin capsule Take 1 capsule by mouth daily.   oxyCODONE 5 MG immediate release tablet Commonly known as: Oxy IR/ROXICODONE Take 1 tablet (  5 mg total) by mouth every 6 (six) hours as needed for moderate pain.   VITAMIN D PO Take 1 tablet by mouth daily.         Follow-up Information    [facility], [facility]. Go on 03/14/2021.   Specialty: General Surgery Why: 10:15 AM.  Please arrive 30 min prior to appointment time. Bring photo ID and insurance information.  Contact information: [address] [place] [address] [phone]               Signed: [name] , [facility] 02/21/2021, 9:27 AM Please see Amion for pager number during day hours 7:00am-4:30pm

## 2021-02-22 LAB — SURGICAL PATHOLOGY

## 2021-03-08 NOTE — Telephone Encounter (Signed)
Patient scheduled 04/13/21 Pollyann Savoy, MD

## 2021-03-31 NOTE — Progress Notes (Signed)
Office Visit Note  Patient: Cathy Kirby             Date of Birth: 11/01/1965           MRN: [id number]             PCP: Sigmund Hazel, MD Referring: Genia Del, MD Visit Date: 04/13/2021 Occupation: @GUAROCC @  Subjective:  Pain in multiple joints and muscles.   History of Present Illness: Cathy Kirby is a 55 y.o. female seen in consultation per request of her GYN.  According the patient her symptoms a started about 10 to 15 years ago with joint pain, muscle pain, fatigue and weakness.  She has had pain in multiple joints and muscles over the years.  She states she mostly ignored her symptoms.  She went for her annual physicals through her GYN.  She states 1 time her ANA was positive but she did not see a rheumatologist.  In 2014 she started having increased discomfort in her feet especially her right first MTP joint.  She underwent a right first MTP fusion in 2014.  She states after the surgery her symptoms resolved.  She continues to have some discomfort in her left first MTP joint.  She gives history of episodic increased pain.  She describes discomfort in her cervical spine, elbows, wrists, hands, hips, ankles and her feet.  She has noticed swelling in her hands and her feet.  She also feels weakness in her elbows.  She states for the last few years she has been experiencing tremors in her hands which mostly goes to her forearms.  She is also has experienced tremors in her lower extremities and her chin at times.  She saw a neurologist several years ago for syncopal episode but the work-up was negative.  She has not had recurrence of those episodes.  There is no family history of autoimmune disease.  She is gravida 4, para 2, miscarriage 1, ectopic pregnancy 1.  There is no history of blood clots.  Activities of Daily Living:  Patient reports morning stiffness for 1 hour.   Patient Reports nocturnal pain.  Difficulty dressing/grooming: Denies Difficulty climbing  stairs: Denies Difficulty getting out of chair: Denies Difficulty using hands for taps, buttons, cutlery, and/or writing: Denies  Review of Systems  Constitutional:  Positive for fatigue.  HENT:  Negative for mouth sores, mouth dryness and nose dryness.   Eyes:  Positive for photophobia and dryness. Negative for pain and itching.  Respiratory:  Negative for shortness of breath and difficulty breathing.   Cardiovascular:  Negative for chest pain and palpitations.  Gastrointestinal:  Positive for constipation and diarrhea. Negative for blood in stool.  Endocrine: Negative for increased urination.  Genitourinary:  Negative for difficulty urinating.  Musculoskeletal:  Positive for joint pain, joint pain, joint swelling, myalgias, morning stiffness, muscle tenderness and myalgias.  Skin:  Negative for color change, rash, redness and sensitivity to sunlight.  Allergic/Immunologic: Negative for susceptible to infections.  Neurological:  Positive for dizziness, tremors, numbness, headaches and weakness. Negative for memory loss.  Hematological:  Negative for bruising/bleeding tendency and swollen glands.  Psychiatric/Behavioral:  Positive for depressed mood and sleep disturbance. Negative for confusion. The patient is nervous/anxious.    PMFS History:  Patient Active Problem List   Diagnosis Date Noted   Acute calculous cholecystitis 02/19/2021   Menorrhagia with regular cycle 07/27/2016   Positive ANA (antinuclear antibody)    H/O: vasectomy     Past Medical History:  Diagnosis Date   Endometrial polyp    History of ectopic pregnancy    1993  righe side-- treated w/ methotraxate   Menorrhagia    Osteoarthritis    hands, feet   PONV (postoperative nausea and vomiting)    Wears glasses     Family History  Problem Relation Age of Onset   COPD Mother    Osteoporosis Mother    Diabetes Father    Cancer Father        lung   Kidney disease Father    Healthy Brother    Arthritis  Maternal Grandmother    Healthy Son    Healthy Son    Past Surgical History:  Procedure Laterality Date   CESAREAN SECTION  1992;  05-30-2002   CHOLECYSTECTOMY N/A 02/20/2021   Procedure: LAPAROSCOPIC CHOLECYSTECTOMY;  Surgeon: Emelia LoronWakefield, Matthew, MD;  [facility];  Service: General;  Laterality: N/A;   DILATATION & CURETTAGE/HYSTEROSCOPY WITH MYOSURE N/A 10/19/2016   Procedure: DILATATION & CURETTAGE/HYSTEROSCOPY WITH MYOSURE;  Surgeon: Dara Lordsimothy P Fontaine, MD;  Location: [facility];  Service: Gynecology;  Laterality: N/A;  requesting 7:30am start time  Requests one hour OR time.   FOOT SURGERY Right 12/2012   joint fusion   LAPAROTOMY FOR LSO FOR CYST AND TORSION  1982   PELVIC LAPAROSCOPY  02/05/2001   DR. CILIBERTI--- FOR INFERTILITY   TONSILLECTOMY     Social History   Social History Narrative   Not on file   Immunization History  Administered Date(s) Administered   Influenza,inj,Quad PF,6+ Mos 06/17/2018   PFIZER(Purple Top)SARS-COV-2 Vaccination 12/14/2019, 01/04/2020, 09/28/2020   Tdap 11/16/2019     Objective: Vital Signs: BP 126/84 (BP Location: Right Arm, Patient Position: Sitting, Cuff Size: Normal)   Pulse 79   Ht 5\' 2"  (1.575 m)   Wt 195 lb 9.6 oz (88.7 kg)   BMI 35.78 kg/m    Physical Exam Vitals and nursing note reviewed.  Constitutional:      Appearance: She is well-developed.  HENT:     Head: Normocephalic and atraumatic.  Eyes:     Conjunctiva/sclera: Conjunctivae normal.  Cardiovascular:     Rate and Rhythm: Normal rate and regular rhythm.     Heart sounds: Normal heart sounds.  Pulmonary:     Effort: Pulmonary effort is normal.     Breath sounds: Normal breath sounds.  Abdominal:     General: Bowel sounds are normal.     Palpations: Abdomen is soft.  Musculoskeletal:     Cervical back: Normal range of motion.  Lymphadenopathy:     Cervical: No cervical adenopathy.  Skin:    General: Skin is warm and dry.      Capillary Refill: Capillary refill takes less than 2 seconds.  Neurological:     Mental Status: She is alert and oriented to person, place, and time.  Psychiatric:        Behavior: Behavior normal.     Musculoskeletal Exam: C-spine thoracic and lumbar spine were in good range of motion.  She had no SI joint tenderness.  Shoulder joints, elbow joints, wrist joints, MCPs PIPs and DIPs were in good range of motion.  She had bilateral DIP thickening.  No synovitis was noted.  Hip joints were in good range of motion.  She had tenderness over bilateral trochanteric bursa.  Knee joints with good range of motion without any warmth swelling or effusion.  She had no tenderness over ankles.  She had postsurgical change over the  right first MTP joint.  She has tenderness over left first MTP joint.  No synovitis was noted.  CDAI Exam: CDAI Score: -- Patient Global: --; Provider Global: -- Swollen: --; Tender: -- Joint Exam 04/13/2021   No joint exam has been documented for this visit   There is currently no information documented on the homunculus. Go to the Rheumatology activity and complete the homunculus joint exam.  Investigation: No additional findings.  Imaging: No results found.  Recent Labs: Lab Results  Component Value Date   WBC 14.6 (H) 02/20/2021   HGB 13.8 02/20/2021   PLT 246 02/20/2021   NA 139 02/20/2021   K 4.3 02/20/2021   CL 103 02/20/2021   CO2 27 02/20/2021   GLUCOSE 168 (H) 02/20/2021   BUN 10 02/20/2021   CREATININE 0.92 02/20/2021   BILITOT 0.6 02/20/2021   ALKPHOS 87 02/20/2021   AST 89 (H) 02/20/2021   ALT 104 (H) 02/20/2021   PROT 6.7 02/20/2021   ALBUMIN 3.6 02/20/2021   CALCIUM 9.1 02/20/2021   GFRAA >60 10/19/2016    Speciality Comments: No specialty comments available.  Procedures:  No procedures performed Allergies: Patient has no known allergies.   Assessment / Plan:     Visit Diagnoses: Positive ANA (antinuclear antibody) -she gives history  of positive ANA over the years.  I do not have the lab results available.  There is no history of oral ulcers, nasal ulcers, malar rash, photosensitivity, Raynaud's phenomenon or lymphadenopathy.  She gives history of sicca symptoms, joint pain and joint inflammation.  No synovitis was noted.  I will obtain AVISE labs.  Polyarthralgia-she complains of pain and discomfort in her bilateral elbows, wrist joints, hands, hips, knees and her feet.  No synovitis was noted.  Pain in both hands -clinical findings were consistent with osteoarthritis that she had bilateral DIP thickening.  No synovitis was noted.  -Plan: XR Hand 2 View Right, XR Hand 2 View Left, x-rays were consistent with osteoarthritis.  Sedimentation rate  Trochanteric bursitis of both hips-she complains of pain in her bilateral hips.  She had tenderness over bilateral trochanteric bursa consistent with trochanteric bursitis.  A handout on IT band stretches was given.  Pain in both feet -she had right first MTP fusion in 2014 due to osteoarthritis.  She continues to have discomfort in her feet.  No synovitis was noted.  Plan: XR Foot 2 Views Right, XR Foot 2 Views Left.  X-rays showed osteoarthritis in bilateral feet and right first MTP surgical fusion.  Primary osteoarthritis of both feet - s/p Right first MTP fusion .  Neck pain-she complains of neck pain.  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  I have advised her to discuss with her PCP.  Myalgia-she gives history of myalgias for many years.  Is possible that she may have a component of myofascial pain syndrome.  Primary insomnia-she gives history of insomnia.  Good sleep hygiene was discussed.  Other fatigue -she gives history of fatigue for many years.  Plan: CBC with Differential/Platelet, COMPLETE METABOLIC PANEL WITH GFR, CK, TSH, Serum protein electrophoresis with reflex, CANCELED: VITAMIN D 25  Hydroxy (Vit-D Deficiency, Fractures)  Elevated LFTs-labs from Feb 20, 2021 showed elevated LFTs patient states at that time she had cholecystitis and underwent cholecystectomy.  Anxiety and depression-she is taking Prozac which helps.  Coarse tremors-she brought videos on her phone of coarse tremors.  I  advised neurology work-up.  Menorrhagia with regular cycle  Orders: Orders Placed This Encounter  Procedures   XR Hand 2 View Right   XR Hand 2 View Left   XR Foot 2 Views Right   XR Foot 2 Views Left   CBC with Differential/Platelet   COMPLETE METABOLIC PANEL WITH GFR   CK   TSH   Sedimentation rate   Serum protein electrophoresis with reflex    No orders of the defined types were placed in this encounter.  .  Follow-Up Instructions: Return for Positive ANA, polyarthralgia, myalgia.   Pollyann Savoy, MD  Note - This record has been created using Animal nutritionist.  Chart creation errors have been sought, but may not always  have been located. Such creation errors do not reflect on  the standard of medical care.

## 2021-04-13 ENCOUNTER — Ambulatory Visit: Payer: BC Managed Care – PPO | Admitting: Rheumatology

## 2021-04-13 ENCOUNTER — Other Ambulatory Visit: Payer: Self-pay

## 2021-04-13 ENCOUNTER — Ambulatory Visit: Payer: Self-pay

## 2021-04-13 ENCOUNTER — Encounter: Payer: Self-pay | Admitting: Rheumatology

## 2021-04-13 VITALS — BP 126/84 | HR 79 | Ht 62.0 in | Wt 195.6 lb

## 2021-04-13 DIAGNOSIS — R7989 Other specified abnormal findings of blood chemistry: Secondary | ICD-10-CM

## 2021-04-13 DIAGNOSIS — M19072 Primary osteoarthritis, left ankle and foot: Secondary | ICD-10-CM

## 2021-04-13 DIAGNOSIS — R768 Other specified abnormal immunological findings in serum: Secondary | ICD-10-CM | POA: Diagnosis not present

## 2021-04-13 DIAGNOSIS — F32A Depression, unspecified: Secondary | ICD-10-CM

## 2021-04-13 DIAGNOSIS — M79671 Pain in right foot: Secondary | ICD-10-CM | POA: Diagnosis not present

## 2021-04-13 DIAGNOSIS — M791 Myalgia, unspecified site: Secondary | ICD-10-CM

## 2021-04-13 DIAGNOSIS — F419 Anxiety disorder, unspecified: Secondary | ICD-10-CM

## 2021-04-13 DIAGNOSIS — M19071 Primary osteoarthritis, right ankle and foot: Secondary | ICD-10-CM

## 2021-04-13 DIAGNOSIS — M79641 Pain in right hand: Secondary | ICD-10-CM | POA: Diagnosis not present

## 2021-04-13 DIAGNOSIS — M79672 Pain in left foot: Secondary | ICD-10-CM

## 2021-04-13 DIAGNOSIS — G252 Other specified forms of tremor: Secondary | ICD-10-CM

## 2021-04-13 DIAGNOSIS — M7061 Trochanteric bursitis, right hip: Secondary | ICD-10-CM | POA: Diagnosis not present

## 2021-04-13 DIAGNOSIS — N920 Excessive and frequent menstruation with regular cycle: Secondary | ICD-10-CM

## 2021-04-13 DIAGNOSIS — M2550 Pain in unspecified joint: Secondary | ICD-10-CM | POA: Diagnosis not present

## 2021-04-13 DIAGNOSIS — M79642 Pain in left hand: Secondary | ICD-10-CM | POA: Diagnosis not present

## 2021-04-13 DIAGNOSIS — F5101 Primary insomnia: Secondary | ICD-10-CM

## 2021-04-13 DIAGNOSIS — M7062 Trochanteric bursitis, left hip: Secondary | ICD-10-CM

## 2021-04-13 DIAGNOSIS — R5383 Other fatigue: Secondary | ICD-10-CM

## 2021-04-13 DIAGNOSIS — M542 Cervicalgia: Secondary | ICD-10-CM

## 2021-04-13 NOTE — Patient Instructions (Signed)
Iliotibial Band Syndrome Rehab Ask your health care provider which exercises are safe for you. Do exercises exactly as told by your health care provider and adjust them as directed. It is normal to feel mild stretching, pulling, tightness, or discomfort as you do these exercises. Stop right away if you feel sudden pain or your pain gets significantly worse. Do not begin these exercises until told by your health care provider. Stretching and range-of-motion exercises These exercises warm up your muscles and joints and improve the movement andflexibility of your hip and pelvis. Quadriceps stretch, prone  Lie on your abdomen (prone position) on a firm surface, such as a bed or padded floor. Bend your left / right knee and reach back to hold your ankle or pant leg. If you cannot reach your ankle or pant leg, loop a belt around your foot and grab the belt instead. Gently pull your heel toward your buttocks. Your knee should not slide out to the side. You should feel a stretch in the front of your thigh and knee (quadriceps). Hold this position for __________ seconds. Repeat __________ times. Complete this exercise __________ times a day. Iliotibial band stretch An iliotibial band is a strong band of muscle tissue that runs from the outer side of your hip to the outer side of your thigh and knee. Lie on your side with your left / right leg in the top position. Bend both of your knees and grab your left / right ankle. Stretch out your bottom arm to help you balance. Slowly bring your top knee back so your thigh goes behind your trunk. Slowly lower your top leg toward the floor until you feel a gentle stretch on the outside of your left / right hip and thigh. If you do not feel a stretch and your knee will not fall farther, place the heel of your other foot on top of your knee and pull your knee down toward the floor with your foot. Hold this position for __________ seconds. Repeat __________ times.  Complete this exercise __________ times a day. Strengthening exercises These exercises build strength and endurance in your hip and pelvis. Enduranceis the ability to use your muscles for a long time, even after they get tired. Straight leg raises, side-lying This exercise strengthens the muscles that rotate the leg at the hip and move it away from your body (hip abductors). Lie on your side with your left / right leg in the top position. Lie so your head, shoulder, hip, and knee line up. You may bend your bottom knee to help you balance. Roll your hips slightly forward so your hips are stacked directly over each other and your left / right knee is facing forward. Tense the muscles in your outer thigh and lift your top leg 4-6 inches (10-15 cm). Hold this position for __________ seconds. Slowly lower your leg to return to the starting position. Let your muscles relax completely before doing another repetition. Repeat __________ times. Complete this exercise __________ times a day. Leg raises, prone This exercise strengthens the muscles that move the hips backward (hip extensors). Lie on your abdomen (prone position) on your bed or a firm surface. You can put a pillow under your hips if that is more comfortable for your lower back. Bend your left / right knee so your foot is straight up in the air. Squeeze your buttocks muscles and lift your left / right thigh off the bed. Do not let your back arch. Tense your thigh   muscle as hard as you can without increasing any knee pain. Hold this position for __________ seconds. Slowly lower your leg to return to the starting position and allow it to relax completely. Repeat __________ times. Complete this exercise __________ times a day. Hip hike Stand sideways on a bottom step. Stand on your left / right leg with your other foot unsupported next to the step. You can hold on to a railing or wall for balance if needed. Keep your knees straight and your  torso square. Then lift your left / right hip up toward the ceiling. Slowly let your left / right hip lower toward the floor, past the starting position. Your foot should get closer to the floor. Do not lean or bend your knees. Repeat __________ times. Complete this exercise __________ times a day. This information is not intended to replace advice given to you by your health care provider. Make sure you discuss any questions you have with your healthcare provider. Document Revised: 11/25/2019 Document Reviewed: 11/25/2019 [employer] Patient Education  2022 [employer]  

## 2021-04-17 LAB — CBC WITH DIFFERENTIAL/PLATELET
Absolute Monocytes: 780 cells/uL (ref 200–950)
Basophils Absolute: 53 cells/uL (ref 0–200)
Basophils Relative: 0.7 %
Eosinophils Absolute: 263 cells/uL (ref 15–500)
Eosinophils Relative: 3.5 %
HCT: 44 % (ref 35.0–45.0)
Hemoglobin: 14.5 g/dL (ref 11.7–15.5)
Lymphs Abs: 2918 cells/uL (ref 850–3900)
MCH: 29.1 pg (ref 27.0–33.0)
MCHC: 33 g/dL (ref 32.0–36.0)
MCV: 88.4 fL (ref 80.0–100.0)
MPV: 11.7 fL (ref 7.5–12.5)
Monocytes Relative: 10.4 %
Neutro Abs: 3488 cells/uL (ref 1500–7800)
Neutrophils Relative %: 46.5 %
Platelets: 296 10*3/uL (ref 140–400)
RBC: 4.98 10*6/uL (ref 3.80–5.10)
RDW: 14 % (ref 11.0–15.0)
Total Lymphocyte: 38.9 %
WBC: 7.5 10*3/uL (ref 3.8–10.8)

## 2021-04-17 LAB — CK: Total CK: 38 U/L (ref 29–143)

## 2021-04-17 LAB — COMPLETE METABOLIC PANEL WITH GFR
AG Ratio: 1.4 (calc) (ref 1.0–2.5)
ALT: 22 U/L (ref 6–29)
AST: 17 U/L (ref 10–35)
Albumin: 4.1 g/dL (ref 3.6–5.1)
Alkaline phosphatase (APISO): 59 U/L (ref 37–153)
BUN: 14 mg/dL (ref 7–25)
CO2: 27 mmol/L (ref 20–32)
Calcium: 10 mg/dL (ref 8.6–10.4)
Chloride: 105 mmol/L (ref 98–110)
Creat: 0.8 mg/dL (ref 0.50–1.03)
Globulin: 2.9 g/dL (calc) (ref 1.9–3.7)
Glucose, Bld: 80 mg/dL (ref 65–99)
Potassium: 4.5 mmol/L (ref 3.5–5.3)
Sodium: 139 mmol/L (ref 135–146)
Total Bilirubin: 0.5 mg/dL (ref 0.2–1.2)
Total Protein: 7 g/dL (ref 6.1–8.1)
eGFR: 88 mL/min/{1.73_m2} (ref >=60)

## 2021-04-17 LAB — PROTEIN ELECTROPHORESIS, SERUM, WITH REFLEX
Albumin ELP: 4.1 g/dL (ref 3.8–4.8)
Alpha 1: 0.3 g/dL (ref 0.2–0.3)
Alpha 2: 0.7 g/dL (ref 0.5–0.9)
Beta 2: 0.3 g/dL (ref 0.2–0.5)
Beta Globulin: 0.5 g/dL (ref 0.4–0.6)
Gamma Globulin: 1.1 g/dL (ref 0.8–1.7)
Total Protein: 6.9 g/dL (ref 6.1–8.1)

## 2021-04-17 LAB — SEDIMENTATION RATE: Sed Rate: 9 mm/h (ref 0–30)

## 2021-04-17 LAB — TSH: TSH: 1.65 mIU/L

## 2021-04-23 NOTE — Progress Notes (Signed)
Office Visit Note  Patient: Cathy Kirby             Date of Birth: [birth year]           MRN: [id number]             PCP: Sigmund Hazel, MD Referring: Sigmund Hazel, MD Visit Date: 05/05/2021 Occupation: @GUAROCC @  Subjective:  Results ([facility] lab results)   History of Present Illness: KEONI HAVEY is a 55 y.o. female with history of positive ANA, osteoarthritis and myofascial pain syndrome.  She states she continues to have discomfort in her hands and feet but she has not seen any joint swelling.  She also has generalized fatigue and generalized pain.  She continues to have discomfort in her bilateral hips which she describes over trochanteric bursa.  She has not started doing any stretching exercises yet.  Activities of Daily Living:  Patient reports morning stiffness for 1 hour.   Patient Reports nocturnal pain.  Difficulty dressing/grooming: Denies Difficulty climbing stairs: Denies Difficulty getting out of chair: Denies Difficulty using hands for taps, buttons, cutlery, and/or writing: Denies  Review of Systems  Constitutional:  Positive for fatigue.  HENT:  Negative for mouth dryness.   Eyes:  Positive for dryness.  Respiratory:  Negative for shortness of breath.   Cardiovascular:  Positive for swelling in legs/feet.  Gastrointestinal:  Positive for constipation and diarrhea.  Endocrine: Negative for excessive thirst.  Genitourinary:  Negative for difficulty urinating.  Musculoskeletal:  Positive for joint pain, gait problem, joint pain, joint swelling, muscle weakness, morning stiffness and muscle tenderness.  Skin:  Negative for rash.  Allergic/Immunologic: Negative for susceptible to infections.  Neurological:  Positive for numbness and weakness.  Hematological:  Negative for bruising/bleeding tendency.  Psychiatric/Behavioral:  Negative for sleep disturbance.    PMFS History:  Patient Active Problem List   Diagnosis Date Noted   Acute calculous  cholecystitis 02/19/2021   Menorrhagia with regular cycle 07/27/2016   Positive ANA (antinuclear antibody)    H/O: vasectomy     Past Medical History:  Diagnosis Date   Endometrial polyp    History of ectopic pregnancy    1993  righe side-- treated w/ methotraxate   Menorrhagia    Osteoarthritis    hands, feet   PONV (postoperative nausea and vomiting)    Wears glasses     Family History  Problem Relation Age of Onset   COPD Mother    Osteoporosis Mother    Diabetes Father    Cancer Father        lung   Kidney disease Father    Healthy Brother    Arthritis Maternal Grandmother    Healthy Son    Healthy Son    Past Surgical History:  Procedure Laterality Date   CESAREAN SECTION  1992;  05-30-2002   CHOLECYSTECTOMY N/A 02/20/2021   Procedure: LAPAROSCOPIC CHOLECYSTECTOMY;  Surgeon: 02/22/2021, MD;  Location: [facility] OR;  Service: General;  Laterality: N/A;   DILATATION & CURETTAGE/HYSTEROSCOPY WITH MYOSURE N/A 10/19/2016   Procedure: DILATATION & CURETTAGE/HYSTEROSCOPY WITH MYOSURE;  Surgeon: 10/21/2016, MD;  Location: [facility];  Service: Gynecology;  Laterality: N/A;  requesting 7:30am start time  Requests one hour OR time.   FOOT SURGERY Right 12/2012   joint fusion   LAPAROTOMY FOR LSO FOR CYST AND TORSION  1982   PELVIC LAPAROSCOPY  02/05/2001   DR. CILIBERTI--- FOR INFERTILITY   TONSILLECTOMY     Social History  Social History Narrative   Not on file   Immunization History  Administered Date(s) Administered   Influenza,inj,Quad PF,6+ Mos 06/17/2018   PFIZER(Purple Top)SARS-COV-2 Vaccination 12/14/2019, 01/04/2020, 09/28/2020   Tdap 11/16/2019     Objective: Vital Signs: BP 98/67 (BP Location: Right Arm, Patient Position: Sitting, Cuff Size: Normal)   Pulse 84   Resp 15   Ht 5\' 2"  (1.575 m)   Wt 197 lb (89.4 kg)   BMI 36.03 kg/m    Physical Exam Vitals and nursing note reviewed.  Constitutional:      Appearance: She is  well-developed.  HENT:     Head: Normocephalic and atraumatic.  Eyes:     Conjunctiva/sclera: Conjunctivae normal.  Cardiovascular:     Rate and Rhythm: Normal rate and regular rhythm.     Heart sounds: Normal heart sounds.  Pulmonary:     Effort: Pulmonary effort is normal.     Breath sounds: Normal breath sounds.  Abdominal:     General: Bowel sounds are normal.     Palpations: Abdomen is soft.  Musculoskeletal:     Cervical back: Normal range of motion.  Lymphadenopathy:     Cervical: No cervical adenopathy.  Skin:    General: Skin is warm and dry.     Capillary Refill: Capillary refill takes less than 2 seconds.  Neurological:     Mental Status: She is alert and oriented to person, place, and time.  Psychiatric:        Behavior: Behavior normal.     Musculoskeletal Exam: C-spine was in good range of motion.  Shoulder joints, elbow joints, wrist joints, MCPs PIPs and DIPs with good range of motion.  She had bilateral PIP and DIP thickening consistent with osteoarthritis.  Hip joints and knee joints with good range of motion.  She had tenderness over bilateral trochanteric bursa.  There is no tenderness over ankles or MTPs.  CDAI Exam: CDAI Score: -- Patient Global: --; Provider Global: -- Swollen: --; Tender: -- Joint Exam 05/05/2021   No joint exam has been documented for this visit   There is currently no information documented on the homunculus. Go to the Rheumatology activity and complete the homunculus joint exam.  Investigation: No additional findings.  Imaging: XR Foot 2 Views Left  Result Date: 04/13/2021 Severe narrowing of first MTP joint, PIP and DIP narrowing was noted.  No intertarsal, tibiotalar or subtalar joint space narrowing was noted.  A small inferior calcaneal spur was noted.  No erosive changes were noted. Impression: These findings are consistent with osteoarthritis of the foot.  XR Foot 2 Views Right  Result Date: 04/13/2021 Hardware was  noted in the first MTP joint from previous fusion.  PIP and DIP narrowing was noted.  No other MTP joints narrowing was noted.  Possible callus was noted in the second metatarsal.  No intertarsal, tibiotalar or subtalar joint space narrowing was noted. Impression: These findings are consistent with osteoarthritis of the foot.  XR Hand 2 View Left  Result Date: 04/13/2021 CMC, PIP and DIP narrowing was noted.  No MCP, intercarpal or radiocarpal joint space narrowing was noted.  No erosive changes were noted. Impression: These findings are consistent with osteoarthritis of the hand.  XR Hand 2 View Right  Result Date: 04/13/2021 PIP and DIP narrowing was noted.  Most severe narrowing was noted in the right fourth DIP joint.  No MCP, intercarpal or radiocarpal joint space narrowing was noted. Impression: These findings are consistent with osteoarthritis of  the hand.   Recent Labs: Lab Results  Component Value Date   WBC 7.5 04/13/2021   HGB 14.5 04/13/2021   PLT 296 04/13/2021   NA 139 04/13/2021   K 4.5 04/13/2021   CL 105 04/13/2021   CO2 27 04/13/2021   GLUCOSE 80 04/13/2021   BUN 14 04/13/2021   CREATININE 0.80 04/13/2021   BILITOT 0.5 04/13/2021   ALKPHOS 87 02/20/2021   AST 17 04/13/2021   ALT 22 04/13/2021   PROT 7.0 04/13/2021   PROT 6.9 04/13/2021   ALBUMIN 3.6 02/20/2021   CALCIUM 10.0 04/13/2021   GFRAA >60 10/19/2016   April 13, 2021 SPEP normal, CK 38, TSH normal, sed rate 9  April 13, 2021 [facility] lupus index -1.3, ANA 1: 320NS, ENA negative, Jo 1 negative, anticardiolipin negative, beta-2 GP 1 negative, antiphosphatidylserine negative, antihistone weak positive, RF negative, anti-CCP negative, anti-Car P IgG positive, antithyroglobulin negative, anti-TPO negative   Speciality Comments: No specialty comments available.  Procedures:  No procedures performed Allergies: Patient has no known allergies.   Assessment / Plan:     Visit Diagnoses: Positive ANA (antinuclear  antibody) - History of positive ANA for many years per patient.  History of sicca symptoms, joint pain and intermittent inflammation.[facility] lupus index was -1.3.  ENA was negative and complements were normal.  She has no clinical features of lupus.  I had detailed discussion regarding the lab work with the patient.  She has anti-car P antibody positive which can be seen sometimes with rheumatoid arthritis.  She has no clinical features of rheumatoid arthritis.  I advised her to contact me in case she develops any new symptoms.  Polyarthralgia - discomfort in her bilateral elbows, wrist joints, hands, hips, knees and her feet.  She had no synovitis on examination.  Primary osteoarthritis of both hands - Clinical and radiographic findings are consistent with osteoarthritis.  X-ray findings are reviewed with the patient.  Hand muscle strengthening exercises were discussed at length.  A handout on hand exercises was given.  Joint protection was discussed.  Trochanteric bursitis of both hips - A handout on IT band stretches was given at the last visit.  She was encouraged to do IT band stretches.  I also offered referral to physical therapy which she declined.  Primary osteoarthritis of both feet - Right first MTP fusion 2014.  Clinical and radiographic findings were consistent with osteoarthritis.  X-ray findings were discussed with the patient.  Proper fitting shoes were discussed.  Neck pain - Possible lipoma at the base of her neck on the right side.  Primary insomnia - History of chronic insomnia.  Good sleep hygiene was discussed.  Other fatigue - History of fatigue for many years.  All autoimmune work-up was negative.  Myofascial pain - She has generalized pain, chronic fatigue, hyperalgesia and tender points.  She was encouraged to do water aerobics or daily exercise.  Anxiety and depression - She is on Prozac.  Coarse tremors - Appointment with neurologist was advised.  Patient plans to  discuss this further with Dr. Hyacinth Meeker.  Menorrhagia with regular cycle  Orders: No orders of the defined types were placed in this encounter.  No orders of the defined types were placed in this encounter.    Follow-Up Instructions: Return if symptoms worsen or fail to improve, for Osteoarthritis.   Pollyann Savoy, MD  Note - This record has been created using Animal nutritionist.  Chart creation errors have been sought, but may not always  have been located. Such creation errors do not reflect on  the standard of medical care.

## 2021-05-05 ENCOUNTER — Encounter: Payer: Self-pay | Admitting: Rheumatology

## 2021-05-05 ENCOUNTER — Ambulatory Visit: Payer: BC Managed Care – PPO | Admitting: Rheumatology

## 2021-05-05 ENCOUNTER — Other Ambulatory Visit: Payer: Self-pay

## 2021-05-05 VITALS — BP 98/67 | HR 84 | Resp 15 | Ht 62.0 in | Wt 197.0 lb

## 2021-05-05 DIAGNOSIS — F419 Anxiety disorder, unspecified: Secondary | ICD-10-CM

## 2021-05-05 DIAGNOSIS — M19072 Primary osteoarthritis, left ankle and foot: Secondary | ICD-10-CM

## 2021-05-05 DIAGNOSIS — R768 Other specified abnormal immunological findings in serum: Secondary | ICD-10-CM

## 2021-05-05 DIAGNOSIS — M19071 Primary osteoarthritis, right ankle and foot: Secondary | ICD-10-CM

## 2021-05-05 DIAGNOSIS — F5101 Primary insomnia: Secondary | ICD-10-CM

## 2021-05-05 DIAGNOSIS — M19041 Primary osteoarthritis, right hand: Secondary | ICD-10-CM | POA: Diagnosis not present

## 2021-05-05 DIAGNOSIS — M7918 Myalgia, other site: Secondary | ICD-10-CM

## 2021-05-05 DIAGNOSIS — N920 Excessive and frequent menstruation with regular cycle: Secondary | ICD-10-CM

## 2021-05-05 DIAGNOSIS — F32A Depression, unspecified: Secondary | ICD-10-CM

## 2021-05-05 DIAGNOSIS — R5383 Other fatigue: Secondary | ICD-10-CM

## 2021-05-05 DIAGNOSIS — M7061 Trochanteric bursitis, right hip: Secondary | ICD-10-CM

## 2021-05-05 DIAGNOSIS — M2550 Pain in unspecified joint: Secondary | ICD-10-CM | POA: Diagnosis not present

## 2021-05-05 DIAGNOSIS — G252 Other specified forms of tremor: Secondary | ICD-10-CM

## 2021-05-05 DIAGNOSIS — M542 Cervicalgia: Secondary | ICD-10-CM

## 2021-05-05 DIAGNOSIS — M7062 Trochanteric bursitis, left hip: Secondary | ICD-10-CM

## 2021-05-05 DIAGNOSIS — M19042 Primary osteoarthritis, left hand: Secondary | ICD-10-CM

## 2021-05-05 NOTE — Patient Instructions (Signed)
Back Exercises The following exercises strengthen the muscles that help to support the trunk and back. They also help to keep the lower back flexible. Doing these exercises can help to prevent back pain or lessen existing pain. If you have back pain or discomfort, try doing these exercises 2-3 times each day or as told by your health care provider. As your pain improves, do them once each day, but increase the number of times that you repeat the steps for each exercise (do more repetitions). To prevent the recurrence of back pain, continue to do these exercises once each day or as told by your health care provider. Do exercises exactly as told by your health care provider and adjust them as directed. It is normal to feel mild stretching, pulling, tightness, or discomfort as you do these exercises, but you should stop right away if youfeel sudden pain or your pain gets worse. Exercises Single knee to chest Repeat these steps 3-5 times for each leg: Lie on your back on a firm bed or the floor with your legs extended. Bring one knee to your chest. Your other leg should stay extended and in contact with the floor. Hold your knee in place by grabbing your knee or thigh with both hands and hold. Pull on your knee until you feel a gentle stretch in your lower back or buttocks. Hold the stretch for 10-30 seconds. Slowly release and straighten your leg. Pelvic tilt Repeat these steps 5-10 times: Lie on your back on a firm bed or the floor with your legs extended. Bend your knees so they are pointing toward the ceiling and your feet are flat on the floor. Tighten your lower abdominal muscles to press your lower back against the floor. This motion will tilt your pelvis so your tailbone points up toward the ceiling instead of pointing to your feet or the floor. With gentle tension and even breathing, hold this position for 5-10 seconds. Cat-cow Repeat these steps until your lower back becomes more  flexible: Get into a hands-and-knees position on a firm surface. Keep your hands under your shoulders, and keep your knees under your hips. You may place padding under your knees for comfort. Let your head hang down toward your chest. Contract your abdominal muscles and point your tailbone toward the floor so your lower back becomes rounded like the back of a cat. Hold this position for 5 seconds. Slowly lift your head, let your abdominal muscles relax and point your tailbone up toward the ceiling so your back forms a sagging arch like the back of a cow. Hold this position for 5 seconds.  Press-ups Repeat these steps 5-10 times: Lie on your abdomen (face-down) on the floor. Place your palms near your head, about shoulder-width apart. Keeping your back as relaxed as possible and keeping your hips on the floor, slowly straighten your arms to raise the top half of your body and lift your shoulders. Do not use your back muscles to raise your upper torso. You may adjust the placement of your hands to make yourself more comfortable. Hold this position for 5 seconds while you keep your back relaxed. Slowly return to lying flat on the floor.  Bridges Repeat these steps 10 times: Lie on your back on a firm surface. Bend your knees so they are pointing toward the ceiling and your feet are flat on the floor. Your arms should be flat at your sides, next to your body. Tighten your buttocks muscles and lift your   buttocks off the floor until your waist is at almost the same height as your knees. You should feel the muscles working in your buttocks and the back of your thighs. If you do not feel these muscles, slide your feet 1-2 inches farther away from your buttocks. Hold this position for 3-5 seconds. Slowly lower your hips to the starting position, and allow your buttocks muscles to relax completely. If this exercise is too easy, try doing it with your arms crossed over yourchest. Abdominal  crunches Repeat these steps 5-10 times: Lie on your back on a firm bed or the floor with your legs extended. Bend your knees so they are pointing toward the ceiling and your feet are flat on the floor. Cross your arms over your chest. Tip your chin slightly toward your chest without bending your neck. Tighten your abdominal muscles and slowly raise your trunk (torso) high enough to lift your shoulder blades a tiny bit off the floor. Avoid raising your torso higher than that because it can put too much stress on your low back and does not help to strengthen your abdominal muscles. Slowly return to your starting position. Back lifts Repeat these steps 5-10 times: Lie on your abdomen (face-down) with your arms at your sides, and rest your forehead on the floor. Tighten the muscles in your legs and your buttocks. Slowly lift your chest off the floor while you keep your hips pressed to the floor. Keep the back of your head in line with the curve in your back. Your eyes should be looking at the floor. Hold this position for 3-5 seconds. Slowly return to your starting position. Contact a health care provider if: Your back pain or discomfort gets much worse when you do an exercise. Your worsening back pain or discomfort does not lessen within 2 hours after you exercise. If you have any of these problems, stop doing these exercises right away. Do not do them again unless your health care provider says that you can. Get help right away if: You develop sudden, severe back pain. If this happens, stop doing the exercises right away. Do not do them again unless your health care provider says that you can. This information is not intended to replace advice given to you by your health care provider. Make sure you discuss any questions you have with your healthcare provider. Document Revised: 01/22/2019 Document Reviewed: 06/19/2018 Elsevier Patient Education  2022 [employer] Hand Exercises Hand  exercises can be helpful for almost anyone. These exercises can strengthen the hands, improve flexibility and movement, and increase blood flow to the hands. These results can make work and daily tasks easier. Hand exercises can be especially helpful for people who have joint pain from arthritis or have nerve damage from overuse (carpal tunnel syndrome). These exercises can also help people who have injured a hand. Exercises Most of these hand exercises are gentle stretching and motion exercises. It is usually safe to do them often throughout the day. Warming up your hands before exercise may help to reduce stiffness. You can do this with gentle massage orby placing your hands in warm water for 10-15 minutes. It is normal to feel some stretching, pulling, tightness, or mild discomfort as you begin new exercises. This will gradually improve. Stop an exercise right away if you feel sudden, severe pain or your pain gets worse. Ask your healthcare provider which exercises are best for you. Knuckle bend or "claw" fist Stand or sit with your arm, hand,   and all five fingers pointed straight up. Make sure to keep your wrist straight during the exercise. Gently bend your fingers down toward your palm until the tips of your fingers are touching the top of your palm. Keep your big knuckle straight and just bend the small knuckles in your fingers. Hold this position for __________ seconds. Straighten (extend) your fingers back to the starting position. Repeat this exercise 5-10 times with each hand. Full finger fist Stand or sit with your arm, hand, and all five fingers pointed straight up. Make sure to keep your wrist straight during the exercise. Gently bend your fingers into your palm until the tips of your fingers are touching the middle of your palm. Hold this position for __________ seconds. Extend your fingers back to the starting position, stretching every joint fully. Repeat this exercise 5-10 times with  each hand. Straight fist Stand or sit with your arm, hand, and all five fingers pointed straight up. Make sure to keep your wrist straight during the exercise. Gently bend your fingers at the big knuckle, where your fingers meet your hand, and the middle knuckle. Keep the knuckle at the tips of your fingers straight and try to touch the bottom of your palm. Hold this position for __________ seconds. Extend your fingers back to the starting position, stretching every joint fully. Repeat this exercise 5-10 times with each hand. Tabletop Stand or sit with your arm, hand, and all five fingers pointed straight up. Make sure to keep your wrist straight during the exercise. Gently bend your fingers at the big knuckle, where your fingers meet your hand, as far down as you can while keeping the small knuckles in your fingers straight. Think of forming a tabletop with your fingers. Hold this position for __________ seconds. Extend your fingers back to the starting position, stretching every joint fully. Repeat this exercise 5-10 times with each hand. Finger spread Place your hand flat on a table with your palm facing down. Make sure your wrist stays straight as you do this exercise. Spread your fingers and thumb apart from each other as far as you can until you feel a gentle stretch. Hold this position for __________ seconds. Bring your fingers and thumb tight together again. Hold this position for __________ seconds. Repeat this exercise 5-10 times with each hand. Making circles Stand or sit with your arm, hand, and all five fingers pointed straight up. Make sure to keep your wrist straight during the exercise. Make a circle by touching the tip of your thumb to the tip of your index finger. Hold for __________ seconds. Then open your hand wide. Repeat this motion with your thumb and each finger on your hand. Repeat this exercise 5-10 times with each hand. Thumb motion Sit with your forearm resting on  a table and your wrist straight. Your thumb should be facing up toward the ceiling. Keep your fingers relaxed as you move your thumb. Lift your thumb up as high as you can toward the ceiling. Hold for __________ seconds. Bend your thumb across your palm as far as you can, reaching the tip of your thumb for the small finger (pinkie) side of your palm. Hold for __________ seconds. Repeat this exercise 5-10 times with each hand. Grip strengthening  Hold a stress ball or other soft ball in the middle of your hand. Slowly increase the pressure, squeezing the ball as much as you can without causing pain. Think of bringing the tips of your fingers into the   middle of your palm. All of your finger joints should bend when doing this exercise. Hold your squeeze for __________ seconds, then relax. Repeat this exercise 5-10 times with each hand. Contact a health care provider if: Your hand pain or discomfort gets much worse when you do an exercise. Your hand pain or discomfort does not improve within 2 hours after you exercise. If you have any of these problems, stop doing these exercises right away. Do not do them again unless your health care provider says that you can. Get help right away if: You develop sudden, severe hand pain or swelling. If this happens, stop doing these exercises right away. Do not do them again unless your health care provider says that you can. This information is not intended to replace advice given to you by your health care provider. Make sure you discuss any questions you have with your healthcare provider. Document Revised: 01/08/2019 Document Reviewed: 09/18/2018 Elsevier Patient Education  2022 [employer]  

## 2021-05-29 ENCOUNTER — Encounter: Payer: Self-pay | Admitting: Neurology

## 2021-06-06 NOTE — Progress Notes (Signed)
Assessment/Plan:   1.  Tremor, likely essential  -Videos are most certainly consistent with essential tremor.  Discussed nature and pathophysiology of this.  Discussed treatment options and she really does not want medications for this.  -Patient and I discussed that she does not meet clinical criteria for Parkinson's disease.  Discussed that this is a purely clinical syndrome.  She is worried about this for the future.  She has rare rest tremor of the right thumb.  Discussed DaTscan.  She is interested in pursuing that.  She understands that this does not change diagnosis, nor does it change what we do.  She understands that this would be purely informative.  She understands that dopamine can be lost via scan criteria up to a decade before one would develop a clinical syndrome.  She also understands that there are other syndromes besides Parkinson's disease in which one loses dopamine.  She would like to proceed.  -Patient and I decided to follow-up at least on a yearly basis to make sure that nothing else is developing, or to make sure that she does not want treatment.   Subjective:   Cathy Kirby was seen in consultation in the movement disorder clinic at the request of Jaci Lazier, Traci A, NP.  The evaluation is for tremor.  Medical records from rheumatology are reviewed (Dr. Corliss Skains is the one who recommended evaluation).     Tremor started in about 10/2019.  Patient initially thought that it was "emotional" but worse over the last several months.  Tremor is most noticeable when doing her hair, drinking liquids but she will also note it at rest.  Both hands shake equally.  She feels it in both legs and chin as well.   There is no family hx of tremor.    Affected by caffeine:  No. (drinks yeti size coffee per day) Affected by alcohol:  No. (Alcohol on weekends) Affected by stress:  unsure but may increase it Affected by fatigue:  No. Spills soup if on spoon:  No. Spills glass of liquid  if full:  No. Affects ADL's (tying shoes, brushing teeth, etc):  No.  Current/Previously tried tremor medications: None  Current medications that may exacerbate tremor:  None  Outside reports reviewed: historical medical records, office notes, and referral letter/letters.  No history of neuroimaging of the brain  No Known Allergies  Current Outpatient Medications  Medication Instructions   acetaminophen (TYLENOL) 500 mg, Oral, Every 6 hours PRN   bismuth subsalicylate (PEPTO BISMOL) 524 mg, Oral, As needed   cetirizine (ZYRTEC) 10 mg, Oral, Daily   famotidine (PEPCID) 20 mg, Oral, 2 times daily PRN   fish oil-omega-3 fatty acids 2 g, Oral, Daily,     FLUoxetine (PROZAC) 20 mg, Oral, Daily   ibuprofen (ADVIL) 600 mg, Oral, Every 8 hours PRN   Multiple Vitamin (MULTIVITAMIN) capsule 1 capsule, Oral, Daily,     Red Yeast Rice Extract (RED YEAST RICE PO) Oral, Daily   VITAMIN D PO 1 tablet, Oral, Daily     Objective:   VITALS:   Vitals:   06/08/21 0838  BP: 122/83  Pulse: 93  SpO2: 97%  Weight: 196 lb 3.2 oz (89 kg)  Height: 5\' 2"  (1.575 m)   Gen:  Appears stated age and in NAD. HEENT:  Normocephalic, atraumatic. The mucous membranes are moist. The superficial temporal arteries are without ropiness or tenderness. Cardiovascular: Regular rate and rhythm. Lungs: Clear to auscultation bilaterally. Neck: There are no carotid bruits noted  bilaterally.  NEUROLOGICAL:  Orientation:  The patient is alert and oriented x 3.   Cranial nerves: Extraocular muscles are intact and visual fields are full to confrontational testing. Speech is fluent and clear.  Hearing is intact to conversational tone. Tone: Tone is good throughout. Sensation: Sensation is intact to light touch touch throughout (facial, trunk, extremities). Vibration is intact at the bilateral big toe. There is no extinction with double simultaneous stimulation. There is no sensory dermatomal level  identified. Coordination:  The patient has no dysdiadichokinesia.  She has no decremation, with any form of RAMS, including alternating supination and pronation of the forearm, hand opening and closing, finger taps, heel taps and toe taps bilaterally. Motor: Strength is 5/5 in the bilateral upper and lower extremities.  Shoulder shrug is equal bilaterally.  There is no pronator drift.  There are no fasciculations noted. DTR's: Deep tendon reflexes are 2/4 at the bilateral biceps, triceps, brachioradialis, 2 - at the bilateral patella and achilles.  Plantar responses are downgoing bilaterally. Gait and Station: The patient is able to ambulate without difficulty. The patient is able to heel toe walk without any difficulty. The patient is has mild trouble ambulating in a tandem fashion.  The patient is able to stand in the Romberg position.   MOVEMENT EXAM: Tremor: She brings in some videos today with her having some tremor when she is not using the mouse with both the right and left hand.  She has a video where she is using the curling iron within the left hand and tremors noted.  He also has a video where she is drinking a glass of water and has some tremor in the right hand.  On exam today, she has no rest tremor with distraction procedures.  When she was giving her history, there was a rare tremor in the right thumb.  She has no trouble with Archimedes spirals.  She has some tremor when pouring water from one glass to another, particularly when the water is in the right hand, but she really does not spill the water.  She has no significant intention tremor.  Very mild postural tremor on the left.  I have reviewed and interpreted the following labs independently   Chemistry      Component Value Date/Time   NA 139 04/13/2021 0930   K 4.5 04/13/2021 0930   CL 105 04/13/2021 0930   CO2 27 04/13/2021 0930   BUN 14 04/13/2021 0930   CREATININE 0.80 04/13/2021 0930      Component Value Date/Time    CALCIUM 10.0 04/13/2021 0930   ALKPHOS 87 02/20/2021 0724   AST 17 04/13/2021 0930   ALT 22 04/13/2021 0930   BILITOT 0.5 04/13/2021 0930      Lab Results  Component Value Date   WBC 7.5 04/13/2021   HGB 14.5 04/13/2021   HCT 44.0 04/13/2021   MCV 88.4 04/13/2021   PLT 296 04/13/2021   Lab Results  Component Value Date   TSH 1.65 04/13/2021      Total time spent on today's visit was 60 minutes, including both face-to-face time and nonface-to-face time.  Time included that spent on review of records (prior notes available to me/labs/imaging if pertinent), discussing treatment and goals, answering patient's questions and coordinating care.  CC:  Sigmund Hazel, MD

## 2021-06-08 ENCOUNTER — Ambulatory Visit: Payer: BC Managed Care – PPO | Admitting: Neurology

## 2021-06-08 ENCOUNTER — Other Ambulatory Visit: Payer: Self-pay

## 2021-06-08 ENCOUNTER — Encounter: Payer: Self-pay | Admitting: Neurology

## 2021-06-08 VITALS — BP 122/83 | HR 93 | Ht 62.0 in | Wt 196.2 lb

## 2021-06-08 DIAGNOSIS — R251 Tremor, unspecified: Secondary | ICD-10-CM

## 2021-06-09 ENCOUNTER — Telehealth: Payer: Self-pay | Admitting: Neurology

## 2021-06-09 NOTE — Telephone Encounter (Signed)
Called patient back faxed over consent form

## 2021-06-09 NOTE — Telephone Encounter (Signed)
Pt is returning chelseas call.  

## 2021-07-13 ENCOUNTER — Other Ambulatory Visit (HOSPITAL_COMMUNITY): Payer: BC Managed Care – PPO

## 2021-07-25 ENCOUNTER — Other Ambulatory Visit: Payer: Self-pay

## 2021-07-25 ENCOUNTER — Encounter (HOSPITAL_COMMUNITY)
Admission: RE | Admit: 2021-07-25 | Discharge: 2021-07-25 | Disposition: A | Discharge status: Home / Self Care | Payer: BC Managed Care – PPO | Source: Ambulatory Visit | Attending: Neurology | Admitting: Neurology

## 2021-07-25 DIAGNOSIS — R251 Tremor, unspecified: Secondary | ICD-10-CM | POA: Insufficient documentation

## 2021-07-25 MED ORDER — POTASSIUM IODIDE (ANTIDOTE) 130 MG PO TABS
ORAL_TABLET | ORAL | Status: AC
  Filled 2021-07-25: qty 1

## 2021-07-25 MED ORDER — IOFLUPANE I 123 185 MBQ/2.5ML IV SOLN
5.0000 | Freq: Once | INTRAVENOUS | Status: AC | PRN
  Administered 2021-07-25: 5 via INTRAVENOUS
  Filled 2021-07-25: qty 5

## 2021-07-25 MED ORDER — POTASSIUM IODIDE (ANTIDOTE) 130 MG PO TABS
130.0000 mg | ORAL_TABLET | Freq: Once | ORAL | Status: AC
  Administered 2021-07-25: 130 mg via ORAL

## 2022-01-08 ENCOUNTER — Other Ambulatory Visit: Payer: Self-pay | Admitting: Radiology

## 2022-03-27 ENCOUNTER — Ambulatory Visit: Payer: BC Managed Care – PPO | Admitting: Radiology

## 2022-03-27 ENCOUNTER — Encounter: Payer: Self-pay | Admitting: Radiology

## 2022-03-27 VITALS — BP 112/82

## 2022-03-27 DIAGNOSIS — R3915 Urgency of urination: Secondary | ICD-10-CM | POA: Diagnosis not present

## 2022-03-27 DIAGNOSIS — N915 Oligomenorrhea, unspecified: Secondary | ICD-10-CM | POA: Diagnosis not present

## 2022-03-27 DIAGNOSIS — N905 Atrophy of vulva: Secondary | ICD-10-CM

## 2022-03-27 DIAGNOSIS — N904 Leukoplakia of vulva: Secondary | ICD-10-CM

## 2022-03-27 LAB — URINALYSIS, COMPLETE W/RFL CULTURE
Bacteria, UA: NONE SEEN /HPF
Bilirubin Urine: NEGATIVE
Glucose, UA: NEGATIVE
Hgb urine dipstick: NEGATIVE
Hyaline Cast: NONE SEEN /LPF
Ketones, ur: NEGATIVE
Leukocyte Esterase: NEGATIVE
Nitrites, Initial: NEGATIVE
Protein, ur: NEGATIVE
RBC / HPF: NONE SEEN /HPF (ref 0–2)
Specific Gravity, Urine: 1.01 (ref 1.001–1.035)
WBC, UA: NONE SEEN /HPF (ref 0–5)
pH: 5.5 (ref 5.0–8.0)

## 2022-03-27 LAB — NO CULTURE INDICATED

## 2022-03-27 MED ORDER — ESTRADIOL 0.1 MG/GM VA CREA: 1.0000 | TOPICAL_CREAM | VAGINAL | 12 refills | Status: DC

## 2022-03-27 NOTE — Progress Notes (Signed)
      Subjective: Cathy Kirby is a 56 y.o. female who complains of flare of lichen sclerosus, using clobestasol for maintenance x 1 year. She has noticed a significant loss of vulvar architecture in that time. Burning when urine hits the skin. Vaginal atrophy/discomfort.  Not sexually active. Periods are scant, coming every 6-56mos. Elevated ANA, negative rheumatology work up with Dr Corliss Skains.  Review of Systems  All other systems reviewed and are negative.    Objective:  -Vulva: complete loss of labia majora due to atrophy -Vagina: no discharge present -Cervix: no lesion or discharge, no CMT -Perineum: 8mm area of white skin c/w lichen sclerosus  Chaperone offered and declined.  Assessment:/Plan:   1. Vulvar atrophy  - estradiol (ESTRACE VAGINAL) 0.1 MG/GM vaginal cream; Place 1 Applicatorful vaginally 3 (three) times a week. Apply 1 applicatorful to the vulva 3 times a week at bedtime  Dispense: 42.5 g; Refill: 12  2. Urinary urgency  - Urinalysis,Complete w/RFL Culture  3. Hypomenorrhea/oligomenorrhea  - FSH - Estradiol  4. Lichen sclerosus of vulva Continue clobetasol just on the affected area while we treat the atrophy with estrogen  F/u 3 weeks for AEX/reassessment

## 2022-03-28 LAB — FOLLICLE STIMULATING HORMONE: FSH: 50.6 m[IU]/mL

## 2022-03-28 LAB — ESTRADIOL: Estradiol: <15 pg/mL

## 2022-03-28 NOTE — Telephone Encounter (Signed)
Cathy Kirby the directions on the will need to be changed to grams dosage 1 applicator full will be 4 gram vaginally 3 times weekly. Please advise

## 2022-03-29 ENCOUNTER — Other Ambulatory Visit: Payer: Self-pay | Admitting: *Deleted

## 2022-03-29 DIAGNOSIS — N950 Postmenopausal bleeding: Secondary | ICD-10-CM

## 2022-03-30 ENCOUNTER — Telehealth: Payer: Self-pay | Admitting: *Deleted

## 2022-03-30 DIAGNOSIS — N950 Postmenopausal bleeding: Secondary | ICD-10-CM

## 2022-03-30 NOTE — Telephone Encounter (Signed)
Patient called told to schedule pelvic ultrasound due to PMP, patient is scheduled on 05/09/22. Her annual exam scheduled on 04/11/22. Patient asked if okay for ultrasound to be scheduled this far out? Please advise

## 2022-04-04 NOTE — Telephone Encounter (Signed)
Number given to patient to call [facility] to check if they have anything sooner.

## 2022-04-04 NOTE — Telephone Encounter (Signed)
She can schedule at [facility] imaging if she would like it sooner, or reschedule her visit with me.

## 2022-04-05 ENCOUNTER — Other Ambulatory Visit: Payer: BC Managed Care – PPO

## 2022-04-05 NOTE — Telephone Encounter (Signed)
Patient scheduled on 04/09/22 at [facility] for pelvic ultrasound.

## 2022-04-09 ENCOUNTER — Ambulatory Visit
Admission: RE | Admit: 2022-04-09 | Discharge: 2022-04-09 | Disposition: A | Discharge status: Home / Self Care | Payer: BC Managed Care – PPO | Source: Ambulatory Visit | Attending: Radiology | Admitting: Radiology

## 2022-04-09 DIAGNOSIS — N950 Postmenopausal bleeding: Secondary | ICD-10-CM

## 2022-04-11 ENCOUNTER — Ambulatory Visit (INDEPENDENT_AMBULATORY_CARE_PROVIDER_SITE_OTHER): Payer: BC Managed Care – PPO | Admitting: Radiology

## 2022-04-11 ENCOUNTER — Other Ambulatory Visit (HOSPITAL_COMMUNITY)
Admission: RE | Admit: 2022-04-11 | Discharge: 2022-04-11 | Disposition: A | Discharge status: Home / Self Care | Payer: BC Managed Care – PPO | Source: Ambulatory Visit | Attending: Radiology | Admitting: Radiology

## 2022-04-11 ENCOUNTER — Telehealth: Payer: Self-pay | Admitting: *Deleted

## 2022-04-11 ENCOUNTER — Encounter: Payer: Self-pay | Admitting: Radiology

## 2022-04-11 VITALS — BP 120/82 | Ht 62.0 in | Wt 199.0 lb

## 2022-04-11 DIAGNOSIS — N950 Postmenopausal bleeding: Secondary | ICD-10-CM

## 2022-04-11 DIAGNOSIS — K429 Umbilical hernia without obstruction or gangrene: Secondary | ICD-10-CM

## 2022-04-11 DIAGNOSIS — Z01419 Encounter for gynecological examination (general) (routine) without abnormal findings: Secondary | ICD-10-CM | POA: Diagnosis not present

## 2022-04-11 DIAGNOSIS — N905 Atrophy of vulva: Secondary | ICD-10-CM

## 2022-04-11 DIAGNOSIS — N904 Leukoplakia of vulva: Secondary | ICD-10-CM | POA: Diagnosis not present

## 2022-04-11 NOTE — Telephone Encounter (Signed)
Staff message sent to referral coordinator to call and schedule.

## 2022-04-11 NOTE — Telephone Encounter (Signed)
-----   Message from Tanda Rockers, NP sent at 04/11/2022  8:33 AM EDT ----- Regarding: General surgery referrall Please refer to general surgery, Dr Dwain Sarna (he did her cholecystectomy) for umbilical hernia.

## 2022-04-11 NOTE — Telephone Encounter (Signed)
Pt has an appt with Dr Dwain Sarna on 05/04/22 at 9:10 am.

## 2022-04-11 NOTE — Progress Notes (Signed)
Cathy Kirby 02/03/1966 [id number]   History:  56 y.o. G4P2 presents for annual exam.Elects for pap. Some improvement of vulvar atrophy since starting estrace cream. Ultrasound completed 04/09/22 for post menopausal bleeding.  Gynecologic History Patient's last menstrual period was 02/27/2022 (approximate).   Contraception/Family planning: post menopausal status Sexually active: yes Last Pap: 02/16/21. Results were: normal Last mammogram: 2023. Results were: abnormal with benign biopsy, done at [facility]  Obstetric History OB History  Gravida Para Term Preterm AB Living  4 2     1 2   SAB IAB Ectopic Multiple Live Births      1        # Outcome Date GA Lbr Len/2nd Weight Sex Delivery Anes PTL Lv  4 Para           3 Para           2 Ectopic           1 Gravida              The following portions of the patient's history were reviewed and updated as appropriate: allergies, current medications, past family history, past medical history, past social history, past surgical history, and problem list.  Review of Systems Pertinent items noted in HPI and remainder of comprehensive ROS otherwise negative.   Past medical history, past surgical history, family history and social history were all reviewed and documented in the EPIC chart.  Ultrasound 04/09/22: Narrative & Impression  CLINICAL DATA:  Postmenopausal bleeding.   EXAM: TRANSABDOMINAL AND TRANSVAGINAL ULTRASOUND OF PELVIS   TECHNIQUE: Both transabdominal and transvaginal ultrasound examinations of the pelvis were performed. Transabdominal technique was performed for global imaging of the pelvis including uterus, ovaries, adnexal regions, and pelvic cul-de-sac. It was necessary to proceed with endovaginal exam following the transabdominal exam to visualize the endometrium.   COMPARISON:  Pelvic ultrasound August 15, 2016   FINDINGS: Uterus   Measurements: 9.9 x 4.2 x 5.1 cm = volume: 112 mL. Within  the anterior uterine body there is a 0.8 x 0.6 x 0.6 cm hypoechoic mass, likely a small intramural fibroid. Within the posterior uterine body there is a 1.1 x 0.8 x 1.2 cm hypoechoic mass, likely intramural fibroid.   Endometrium   Thickness: 4 mm.  No focal abnormality visualized.   Right ovary   Measurements: 2.6 x 1.5 x 2.2 cm = volume: 4.4 mL. Normal appearance/no adnexal mass.   Left ovary   Surgically absent   Other findings   No abnormal free fluid.   IMPRESSION: Fibroid uterus.   Endometrium measures 4 mm. In the setting of post-menopausal bleeding, this is consistent with a benign etiology such as endometrial atrophy. If bleeding remains unresponsive to hormonal or medical therapy, sonohysterogram should be considered for focal lesion work-up. (Ref: Radiological Reasoning: Algorithmic Workup of Abnormal Vaginal Bleeding with Endovaginal Sonography and Sonohysterography. AJR 200806-06-2000)     Electronically Signed   By: ; 124:P80-99 M.D.   On: 04/09/2022 15:12    Exam:  Vitals:   04/11/22 0750  BP: 120/82  Weight: 199 lb (90.3 kg)  Height: 5\' 2"  (1.575 m)   Body mass index is 36.4 kg/m.  General appearance:  Normal Thyroid:  Symmetrical, normal in size, without palpable masses or nodularity. Respiratory  Auscultation:  Clear without wheezing or rhonchi Cardiovascular  Auscultation:  Regular rate, without rubs, murmurs or gallops  Edema/varicosities:  Not grossly evident Abdominal  Soft,nontender, without masses, guarding or rebound.  Liver/spleen:  No organomegaly noted  Hernia:  +umbilical  Skin  Inspection:  Grossly normal Breasts: Examined lying and sitting.   Right: Without masses, retractions, nipple discharge or axillary adenopathy.   Left: Without masses, retractions, nipple discharge or axillary adenopathy. Genitourinary   Inguinal/mons:  Normal without inguinal adenopathy  External genitalia:  loss of vulvar architecture,  atrophy of skin tissue moderate  BUS/Urethra/Skene's glands:  Normal without masses or exudate  Vagina:  Normal appearing with normal color and discharge, no lesions, mild atrophy  Cervix:  Normal appearing without discharge or lesions  Uterus:  Normal in size, shape and contour.  Mobile, nontender  Adnexa/parametria:     Rt: Normal in size, without masses or tenderness.   Lt: Normal in size, without masses or tenderness.  Anus and perineum: Normal   Patient informed chaperone available to be present for breast and pelvic exam. Patient has requested no chaperone to be present. Patient has been advised what will be completed during breast and pelvic exam.   Assessment/Plan:   1. Well woman exam with routine gynecological exam  - Cytology - PAP( [place])  2. PMB (postmenopausal bleeding) Reassured endometrial stripe 55mm, if bleeding returns will trial progesterone nightly  3. Vulvar atrophy Continue estrace cream 3x/week  4. Lichen sclerosus et atrophicus of the vulva Continue clobetasol prn  5. Umbilical hernia without obstruction and without gangrene Referral back to Dr Dwain Sarna     Discussed SBE, colonoscopy and DEXA screening as directed/appropriate. Recommend of exercise weekly, including weight bearing exercise. Encouraged the use of seatbelts and sunscreen. Return in 1 year for annual or as needed.   Tanda Rockers WHNP-BC 8:34 AM 04/11/2022

## 2022-04-12 ENCOUNTER — Encounter: Payer: Self-pay | Admitting: Radiology

## 2022-04-13 LAB — CYTOLOGY - PAP
Adequacy: ABSENT
Comment: NEGATIVE
Diagnosis: NEGATIVE
High risk HPV: NEGATIVE

## 2022-05-04 ENCOUNTER — Other Ambulatory Visit: Payer: Self-pay | Admitting: Obstetrics & Gynecology

## 2022-05-04 NOTE — Telephone Encounter (Signed)
Med refill request:Prozac 20 mg Cap Last AEX: 04/11/22/ JC Next AEX: 04/17/23 Last MMG (if hormonal med) N/A Refill authorized: Please Advise?

## 2022-05-09 ENCOUNTER — Other Ambulatory Visit: Payer: BC Managed Care – PPO | Admitting: Radiology

## 2022-05-09 ENCOUNTER — Other Ambulatory Visit: Payer: BC Managed Care – PPO

## 2022-05-11 ENCOUNTER — Other Ambulatory Visit: Payer: Self-pay | Admitting: General Surgery

## 2022-05-11 DIAGNOSIS — K432 Incisional hernia without obstruction or gangrene: Secondary | ICD-10-CM

## 2022-05-31 ENCOUNTER — Other Ambulatory Visit: Payer: BC Managed Care – PPO

## 2022-05-31 ENCOUNTER — Ambulatory Visit
Admission: RE | Admit: 2022-05-31 | Discharge: 2022-05-31 | Disposition: A | Discharge status: Home / Self Care | Payer: BC Managed Care – PPO | Source: Ambulatory Visit | Attending: General Surgery | Admitting: General Surgery

## 2022-05-31 DIAGNOSIS — K432 Incisional hernia without obstruction or gangrene: Secondary | ICD-10-CM

## 2022-05-31 MED ORDER — IOPAMIDOL (ISOVUE-300) INJECTION 61%
100.0000 mL | Freq: Once | INTRAVENOUS | Status: AC | PRN
  Administered 2022-05-31: 100 mL via INTRAVENOUS

## 2022-06-05 ENCOUNTER — Encounter: Payer: Self-pay | Admitting: Neurology

## 2022-06-08 ENCOUNTER — Ambulatory Visit: Payer: BC Managed Care – PPO | Admitting: Neurology

## 2022-06-12 ENCOUNTER — Other Ambulatory Visit: Payer: Self-pay | Admitting: General Surgery

## 2022-06-12 DIAGNOSIS — D7389 Other diseases of spleen: Secondary | ICD-10-CM

## 2022-07-24 ENCOUNTER — Ambulatory Visit
Admission: RE | Admit: 2022-07-24 | Discharge: 2022-07-24 | Disposition: A | Discharge status: Home / Self Care | Payer: BC Managed Care – PPO | Source: Ambulatory Visit | Attending: General Surgery | Admitting: General Surgery

## 2022-07-24 DIAGNOSIS — D7389 Other diseases of spleen: Secondary | ICD-10-CM

## 2022-07-24 MED ORDER — GADOBENATE DIMEGLUMINE 529 MG/ML IV SOLN
18.0000 mL | Freq: Once | INTRAVENOUS | Status: AC | PRN
Start: 2022-07-24 — End: 2022-07-24
  Administered 2022-07-24: 18 mL via INTRAVENOUS

## 2022-09-14 ENCOUNTER — Other Ambulatory Visit: Payer: Self-pay | Admitting: Obstetrics & Gynecology

## 2022-09-14 IMAGING — NM NM DATSCAN
2 series · 12 of 12 positions shown · non-contrast
Comparison: None.

CLINICAL DATA: 54-year-old female with bilateral hand tremors for 1
year.

EXAM:
NUCLEAR MEDICINE BRAIN IMAGING WITH SPECT  (DaTscan )
TECHNIQUE: SPECT images of the brain were obtained after intravenous injection
of radiopharmaceutical. 4 hour post injection imaging. Appropriate
positioning. 130 mg ABOU ADNAN SAT given orally for thyroid blockade.
RADIOPHARMACEUTICALS:  5.0 millicuries I 123 Ioflupane

[Series 1: spect - (id) _(id)_tra · 4.1mm · 4.14mm/px · 6 of 128 frames shown]
[frame 11/128]
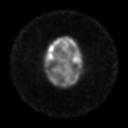
[frame 32/128]
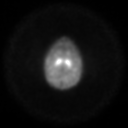
[frame 54/128]
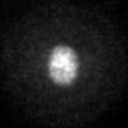
[frame 75/128]
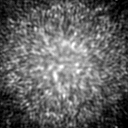
[frame 96/128]
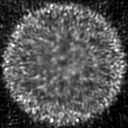
[frame 118/128]
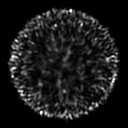

[Series 1: spect - (id) _(id)_cor · 4.1mm · 4.14mm/px · 6 of 128 frames shown]
[frame 11/128]
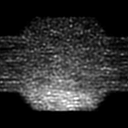
[frame 32/128]
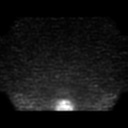
[frame 54/128]
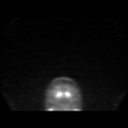
[frame 75/128]
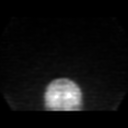
[frame 96/128]
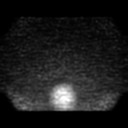
[frame 118/128]
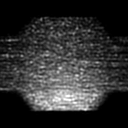

[12 of 12 positions shown; findings below may reference images not displayed]

FINDINGS: Symmetric intense uptake within LEFT and RIGHT striata. The heads of
the caudate nuclei and the posterior striata (putamen) are normal
shape. No evidence of loss of dopamine transport populations in the
basal ganglia.
IMPRESSION: Normal Ioflupane scan. No reduced radiotracer activity in basal
ganglia to suggest Parkinson's syndrome pathology.

Of note, DaTSCAN is not diagnostic of Parkinsonian syndromes, which
remains a clinical diagnosis. DaTscan is an adjuvant test to aid in
the clinical diagnosis of Parkinsonian syndromes.

## 2022-09-14 NOTE — Telephone Encounter (Signed)
Patient scheduled for annual 04/17/23

## 2022-12-05 ENCOUNTER — Ambulatory Visit: Payer: BC Managed Care – PPO | Admitting: Radiology

## 2022-12-05 VITALS — BP 116/74

## 2022-12-05 DIAGNOSIS — N760 Acute vaginitis: Secondary | ICD-10-CM

## 2022-12-05 DIAGNOSIS — N904 Leukoplakia of vulva: Secondary | ICD-10-CM

## 2022-12-05 LAB — WET PREP FOR TRICH, YEAST, CLUE

## 2022-12-05 MED ORDER — TERCONAZOLE 0.4 % VA CREA: 1.0000 | TOPICAL_CREAM | Freq: Every day | VAGINAL | 0 refills | Status: DC

## 2022-12-05 MED ORDER — NYSTATIN-TRIAMCINOLONE 100000-0.1 UNIT/GM-% EX OINT: 1.0000 | TOPICAL_OINTMENT | Freq: Two times a day (BID) | CUTANEOUS | 0 refills | Status: DC

## 2022-12-05 NOTE — Progress Notes (Signed)
      Subjective: Cathy Kirby is a 57 y.o. female who complains of flare of lichen sclerosus, using clobestasol for maintenance over 1 year. She has noticed a significant loss of vulvar architecture in that time. external vulvar burning/irritation. Has vaginal discharge but unsure if it's estrogen cream. Using estrace cream 3x/week and clobetasol prn.   Review of Systems  All other systems reviewed and are negative.    Objective:  Physical Exam Constitutional:      Appearance: She is normal weight.  Pulmonary:     Effort: Pulmonary effort is normal.  Genitourinary:    Vagina: Vaginal discharge and erythema present. No bleeding.     Rectum: No external hemorrhoid.    Neurological:     Mental Status: She is alert.     Microscopic wet-mount exam shows hyphae.   Chaperone offered and declined.  Assessment:/Plan:  1. Vulvovaginitis - nystatin-triamcinolone ointment (MYCOLOG); Apply 1 Application topically 2 (two) times daily.  Dispense: 30 g; Refill: 0 - terconazole (TERAZOL 7) 0.4 % vaginal cream; Place 1 applicator vaginally at bedtime.  Dispense: 45 g; Refill: 0 - WET PREP FOR TRICH, YEAST, CLUE  2. Lichen sclerosus et atrophicus of the vulva Continue clobetasol sparingly prn Continue estrace cream twice weekly to the vulva, may benefit from using 1gram vaginally as well to prevent atrophy of the vagina

## 2022-12-11 ENCOUNTER — Other Ambulatory Visit: Payer: Self-pay | Admitting: General Surgery

## 2022-12-11 NOTE — Progress Notes (Signed)
Surgery orders requested via Epic inbox. °

## 2022-12-11 NOTE — Patient Instructions (Addendum)
DUE TO COVID-19 ONLY TWO VISITORS  (aged 56 and older)  ARE ALLOWED TO COME WITH YOU AND STAY IN THE WAITING ROOM ONLY DURING PRE OP AND PROCEDURE.   **NO VISITORS ARE ALLOWED IN THE SHORT STAY AREA OR RECOVERY ROOM!!**  IF YOU WILL BE ADMITTED INTO THE HOSPITAL YOU ARE ALLOWED ONLY FOUR SUPPORT PEOPLE DURING VISITATION HOURS ONLY (7 AM -8PM)   The support person(s) must pass our screening, gel in and out, and wear a mask at all times, including in the patient's room. Patients must also wear a mask when staff or their support person are in the room. Visitors GUEST BADGE MUST BE WORN VISIBLY  One adult visitor may remain with you overnight and MUST be in the room by 8 P.M.     Your procedure is scheduled on: 12/26/22   Report to [facility] Main Entrance    Report to admitting at  5:15 AM   Call this number if you have problems the morning of surgery [phone]   Do not eat food After Midnight.   After Midnight you may have the following liquids until _4:30_AM/ DAY OF SURGERY  Water Black Coffee (sugar ok, NO MILK/CREAM OR CREAMERS)  Tea (sugar ok, NO MILK/CREAM OR CREAMERS) regular and decaf                             Plain Jell-O (NO RED)                                           Fruit ices (not with fruit pulp, NO RED)                                     Popsicles (NO RED)                                                                  Juice: apple, WHITE grape, WHITE cranberry Sports drinks like Gatorade (NO RED)         Drink the ensure at 4:15 AM and have it finished by 4:30 AM. Nothing more by mouth after 4:30 AM          If you have questions, please contact your surgeon's office.       Oral Hygiene is also important to reduce your risk of infection.                                    Remember - BRUSH YOUR TEETH THE MORNING OF SURGERY WITH YOUR REGULAR TOOTHPASTE   Do NOT smoke after Midnight   Take these medicines the morning of surgery with A SIP OF  WATER: Fluoxetine   Bring CPAP mask and tubing day of surgery.                              You may not have any metal on your body  including hair pins, jewelry, and body piercing             Do not wear make-up, lotions, powders, perfumes/cologne, or deodorant  Do not wear nail polish including gel and S&S, artificial/acrylic nails, or any other type of covering on natural nails including finger and toenails. If you have artificial nails, gel coating, etc. that needs to be removed by a nail salon please have this removed prior to surgery or surgery may need to be canceled/ delayed if the surgeon/ anesthesia feels like they are unable to be safely monitored.   Do not shave  48 hours prior to surgery.     Do not bring valuables to the hospital. [place].   Contacts, glasses, or bridgework may not be worn into surgery.   DO NOT [place].     Patients discharged on the day of surgery will not be allowed to drive home.  Someone NEEDS to stay with you for the first 24 hours after anesthesia.   Special Instructions: Bring a copy of your healthcare power of attorney and living will documents the day of surgery if you haven't scanned them before.              Please read over the following fact sheets you were given: IF YOU HAVE QUESTIONS ABOUT YOUR PRE-OP INSTRUCTIONS PLEASE CALL [phone]    [facility] - Preparing for Surgery Before surgery, you can play an important role.  Because skin is not sterile, your skin needs to be as free of germs as possible.  You can reduce the number of germs on your skin by washing with CHG (chlorahexidine gluconate) soap before surgery.  CHG is an antiseptic cleaner which kills germs and bonds with the skin to continue killing germs even after washing. Please DO NOT use if you have an allergy to CHG or antibacterial soaps.  If your skin becomes reddened/irritated stop  using the CHG and inform your nurse when you arrive at Short Stay. Do not shave (including legs and underarms) for at least 48 hours prior to the first CHG shower.   Please follow these instructions carefully:  1.  Shower with CHG Soap the night before surgery and the  morning of Surgery.  2.  If you choose to wash your hair, wash your hair first as usual with your  normal  shampoo.  3.  After you shampoo, rinse your hair and body thoroughly to remove the  shampoo.                            4.  Use CHG as you would any other liquid soap.  You can apply chg directly  to the skin and wash                       Gently with a scrungie or clean washcloth.  5.  Apply the CHG Soap to your body ONLY FROM THE NECK DOWN.   Do not use on face/ open                           Wound or open sores. Avoid contact with eyes, ears mouth and genitals (private parts).  Wash face,  Genitals (private parts) with your normal soap.             6.  Wash thoroughly, paying special attention to the area where your surgery  will be performed.  7.  Thoroughly rinse your body with warm water from the neck down.  8.  DO NOT shower/wash with your normal soap after using and rinsing off  the CHG Soap.             9.  Pat yourself dry with a clean towel.            10.  Wear clean pajamas.            11.  Place clean sheets on your bed the night of your first shower and do not  sleep with pets. Day of Surgery : Do not apply any lotions/deodorants the morning of surgery.  Please wear clean clothes to the hospital/surgery center.  FAILURE TO FOLLOW THESE INSTRUCTIONS MAY RESULT IN THE CANCELLATION OF YOUR SURGERY  PATIENT SIGNATURE_________________________________   ________________________________________________________________________

## 2022-12-18 ENCOUNTER — Encounter (HOSPITAL_COMMUNITY): Payer: Self-pay | Admitting: *Deleted

## 2022-12-18 ENCOUNTER — Other Ambulatory Visit: Payer: Self-pay

## 2022-12-18 ENCOUNTER — Encounter (HOSPITAL_COMMUNITY)
Admission: RE | Admit: 2022-12-18 | Discharge: 2022-12-18 | Disposition: A | Discharge status: Home / Self Care | Payer: BC Managed Care – PPO | Source: Ambulatory Visit | Attending: General Surgery | Admitting: General Surgery

## 2022-12-18 DIAGNOSIS — Z01812 Encounter for preprocedural laboratory examination: Secondary | ICD-10-CM | POA: Insufficient documentation

## 2022-12-18 DIAGNOSIS — Z01818 Encounter for other preprocedural examination: Secondary | ICD-10-CM

## 2022-12-18 LAB — CBC
HCT: 40.1 % (ref 36.0–46.0)
Hemoglobin: 13.4 g/dL (ref 12.0–15.0)
MCH: 28.7 pg (ref 26.0–34.0)
MCHC: 33.4 g/dL (ref 30.0–36.0)
MCV: 85.9 fL (ref 80.0–100.0)
Platelets: 262 10*3/uL (ref 150–400)
RBC: 4.67 MIL/uL (ref 3.87–5.11)
RDW: 14.5 % (ref 11.5–15.5)
WBC: 6.4 10*3/uL (ref 4.0–10.5)
nRBC: 0 % (ref 0.0–0.2)

## 2022-12-18 NOTE — Progress Notes (Signed)
Anesthesia note:  Bowel prep reminder:  none  PCP - Faustino Congress Cardiologist -none Other-   Chest x-ray - no EKG - no Stress Test - no ECHO - no Cardiac Cath - no CABG-no Pacemaker/ICD device last checked:NA  Sleep Study - no CPAP -   Pt is pre diabetic-no CBG at PAT visit- Fasting Blood Sugar at home- Checks Blood Sugar _____  Blood Thinner:no Blood Thinner Instructions: Aspirin Instructions: Last Dose:  Anesthesia review:  /No   Patient denies shortness of breath, fever, cough and chest pain at PAT appointment. Pt has no SOB with any activities.   Patient verbalized understanding of instructions that were given to them at the PAT appointment. Patient was also instructed that they will need to review over the PAT instructions again at home before surgery.yes

## 2022-12-26 ENCOUNTER — Encounter (HOSPITAL_COMMUNITY): Payer: Self-pay | Admitting: General Surgery

## 2022-12-26 ENCOUNTER — Other Ambulatory Visit: Payer: Self-pay

## 2022-12-26 ENCOUNTER — Encounter (HOSPITAL_COMMUNITY)
Admission: RE | Disposition: A | Discharge status: Home / Self Care | Payer: Self-pay | Source: Ambulatory Visit | Attending: General Surgery

## 2022-12-26 ENCOUNTER — Ambulatory Visit (HOSPITAL_COMMUNITY): Payer: BC Managed Care – PPO | Admitting: Anesthesiology

## 2022-12-26 ENCOUNTER — Ambulatory Visit (HOSPITAL_COMMUNITY)
Admission: RE | Admit: 2022-12-26 | Discharge: 2022-12-26 | Disposition: A | Discharge status: Home / Self Care | Payer: BC Managed Care – PPO | Source: Ambulatory Visit | Attending: General Surgery | Admitting: General Surgery

## 2022-12-26 DIAGNOSIS — K219 Gastro-esophageal reflux disease without esophagitis: Secondary | ICD-10-CM | POA: Diagnosis not present

## 2022-12-26 DIAGNOSIS — Z01818 Encounter for other preprocedural examination: Secondary | ICD-10-CM

## 2022-12-26 DIAGNOSIS — K432 Incisional hernia without obstruction or gangrene: Secondary | ICD-10-CM | POA: Diagnosis present

## 2022-12-26 HISTORY — PX: INCISIONAL HERNIA REPAIR: SHX193

## 2022-12-26 SURGERY — REPAIR, HERNIA, INCISIONAL, LAPAROSCOPIC
Anesthesia: General

## 2022-12-26 MED ORDER — PROPOFOL 10 MG/ML IV BOLUS
INTRAVENOUS | Status: DC | PRN
  Administered 2022-12-26: 150 mg via INTRAVENOUS
  Administered 2022-12-26: 10 mg via INTRAVENOUS

## 2022-12-26 MED ORDER — SCOPOLAMINE 1 MG/3DAYS TD PT72
MEDICATED_PATCH | TRANSDERMAL | Status: AC
  Filled 2022-12-26: qty 1

## 2022-12-26 MED ORDER — GLYCOPYRROLATE 0.2 MG/ML IJ SOLN
INTRAMUSCULAR | Status: DC | PRN
  Administered 2022-12-26: .2 mg via INTRAVENOUS

## 2022-12-26 MED ORDER — METHOCARBAMOL 750 MG PO TABS: 750.0000 mg | ORAL_TABLET | Freq: Three times a day (TID) | ORAL | 0 refills | Status: DC | PRN

## 2022-12-26 MED ORDER — FENTANYL CITRATE PF 50 MCG/ML IJ SOSY
PREFILLED_SYRINGE | INTRAMUSCULAR | Status: AC
  Filled 2022-12-26: qty 3

## 2022-12-26 MED ORDER — LACTATED RINGERS IV SOLN: INTRAVENOUS | Status: DC

## 2022-12-26 MED ORDER — ONDANSETRON HCL 4 MG/2ML IJ SOLN
INTRAMUSCULAR | Status: AC
  Filled 2022-12-26: qty 2

## 2022-12-26 MED ORDER — BUPIVACAINE LIPOSOME 1.3 % IJ SUSP
INTRAMUSCULAR | Status: AC
  Filled 2022-12-26: qty 20

## 2022-12-26 MED ORDER — CHLORHEXIDINE GLUCONATE 0.12 % MT SOLN
15.0000 mL | Freq: Once | OROMUCOSAL | Status: AC
  Administered 2022-12-26: 15 mL via OROMUCOSAL

## 2022-12-26 MED ORDER — ACETAMINOPHEN 500 MG PO TABS
1000.0000 mg | ORAL_TABLET | ORAL | Status: AC
  Administered 2022-12-26: 1000 mg via ORAL
  Filled 2022-12-26: qty 2

## 2022-12-26 MED ORDER — BUPIVACAINE HCL (PF) 0.5 % IJ SOLN
INTRAMUSCULAR | Status: DC | PRN
  Administered 2022-12-26 (×2): 25 mL via PERINEURAL

## 2022-12-26 MED ORDER — PHENYLEPHRINE HCL (PRESSORS) 10 MG/ML IV SOLN
INTRAVENOUS | Status: DC | PRN
  Administered 2022-12-26 (×5): 80 ug via INTRAVENOUS

## 2022-12-26 MED ORDER — BUPIVACAINE-EPINEPHRINE (PF) 0.25% -1:200000 IJ SOLN
INTRAMUSCULAR | Status: AC
  Filled 2022-12-26: qty 30

## 2022-12-26 MED ORDER — PHENYLEPHRINE 80 MCG/ML (10ML) SYRINGE FOR IV PUSH (FOR BLOOD PRESSURE SUPPORT)
PREFILLED_SYRINGE | INTRAVENOUS | Status: AC
  Filled 2022-12-26: qty 10

## 2022-12-26 MED ORDER — DEXAMETHASONE SODIUM PHOSPHATE 10 MG/ML IJ SOLN
INTRAMUSCULAR | Status: AC
  Filled 2022-12-26: qty 1

## 2022-12-26 MED ORDER — DEXAMETHASONE SODIUM PHOSPHATE 10 MG/ML IJ SOLN
INTRAMUSCULAR | Status: DC | PRN
  Administered 2022-12-26: 10 mg via INTRAVENOUS

## 2022-12-26 MED ORDER — PROPOFOL 10 MG/ML IV BOLUS
INTRAVENOUS | Status: AC
  Filled 2022-12-26: qty 20

## 2022-12-26 MED ORDER — ENSURE PRE-SURGERY PO LIQD
296.0000 mL | Freq: Once | ORAL | Status: DC
  Filled 2022-12-26: qty 296

## 2022-12-26 MED ORDER — FENTANYL CITRATE PF 50 MCG/ML IJ SOSY
25.0000 ug | PREFILLED_SYRINGE | INTRAMUSCULAR | Status: DC | PRN
  Administered 2022-12-26: 50 ug via INTRAVENOUS

## 2022-12-26 MED ORDER — BUPIVACAINE-EPINEPHRINE 0.25% -1:200000 IJ SOLN
INTRAMUSCULAR | Status: DC | PRN
  Administered 2022-12-26: 10 mL

## 2022-12-26 MED ORDER — 0.9 % SODIUM CHLORIDE (POUR BTL) OPTIME
TOPICAL | Status: DC | PRN
  Administered 2022-12-26: 1000 mL

## 2022-12-26 MED ORDER — MIDAZOLAM HCL 5 MG/5ML IJ SOLN
INTRAMUSCULAR | Status: DC | PRN
  Administered 2022-12-26: 2 mg via INTRAVENOUS

## 2022-12-26 MED ORDER — SUGAMMADEX SODIUM 200 MG/2ML IV SOLN
INTRAVENOUS | Status: DC | PRN
  Administered 2022-12-26: 200 mg via INTRAVENOUS

## 2022-12-26 MED ORDER — MIDAZOLAM HCL 2 MG/2ML IJ SOLN
INTRAMUSCULAR | Status: AC
  Filled 2022-12-26: qty 2

## 2022-12-26 MED ORDER — CHLORHEXIDINE GLUCONATE CLOTH 2 % EX PADS: 6.0000 | MEDICATED_PAD | Freq: Once | CUTANEOUS | Status: DC

## 2022-12-26 MED ORDER — BUPIVACAINE LIPOSOME 1.3 % IJ SUSP
INTRAMUSCULAR | Status: DC | PRN
  Administered 2022-12-26: 5 mg via PERINEURAL

## 2022-12-26 MED ORDER — CEFAZOLIN SODIUM-DEXTROSE 2-4 GM/100ML-% IV SOLN
2.0000 g | INTRAVENOUS | Status: AC
  Administered 2022-12-26: 2 g via INTRAVENOUS
  Filled 2022-12-26: qty 100

## 2022-12-26 MED ORDER — FENTANYL CITRATE (PF) 100 MCG/2ML IJ SOLN
INTRAMUSCULAR | Status: DC | PRN
  Administered 2022-12-26 (×4): 50 ug via INTRAVENOUS

## 2022-12-26 MED ORDER — LIDOCAINE HCL 2 % IJ SOLN
INTRAMUSCULAR | Status: AC
  Filled 2022-12-26: qty 20

## 2022-12-26 MED ORDER — ROCURONIUM BROMIDE 10 MG/ML (PF) SYRINGE
PREFILLED_SYRINGE | INTRAVENOUS | Status: AC
  Filled 2022-12-26: qty 10

## 2022-12-26 MED ORDER — LIDOCAINE HCL (CARDIAC) PF 100 MG/5ML IV SOSY: PREFILLED_SYRINGE | INTRAVENOUS | Status: DC | PRN

## 2022-12-26 MED ORDER — ONDANSETRON HCL 4 MG/2ML IJ SOLN
INTRAMUSCULAR | Status: DC | PRN
  Administered 2022-12-26: 4 mg via INTRAVENOUS

## 2022-12-26 MED ORDER — OXYCODONE HCL 5 MG PO TABS: 5.0000 mg | ORAL_TABLET | Freq: Four times a day (QID) | ORAL | 0 refills | Status: DC | PRN

## 2022-12-26 MED ORDER — ROCURONIUM BROMIDE 100 MG/10ML IV SOLN
INTRAVENOUS | Status: DC | PRN
  Administered 2022-12-26: 70 mg via INTRAVENOUS

## 2022-12-26 MED ORDER — FENTANYL CITRATE (PF) 100 MCG/2ML IJ SOLN
INTRAMUSCULAR | Status: AC
  Filled 2022-12-26: qty 2

## 2022-12-26 MED ORDER — SCOPOLAMINE 1 MG/3DAYS TD PT72
1.0000 | MEDICATED_PATCH | TRANSDERMAL | Status: DC
  Administered 2022-12-26: 1 via TRANSDERMAL

## 2022-12-26 MED ORDER — LIDOCAINE HCL (PF) 2 % IJ SOLN
INTRAMUSCULAR | Status: AC
  Filled 2022-12-26: qty 5

## 2022-12-26 MED ORDER — ORAL CARE MOUTH RINSE: 15.0000 mL | Freq: Once | OROMUCOSAL | Status: AC

## 2022-12-26 SURGICAL SUPPLY — 55 items
ADH SKN CLS APL DERMABOND .7 (GAUZE/BANDAGES/DRESSINGS) ×1
ANTIFOG SOL W/FOAM PAD STRL (MISCELLANEOUS) ×1
BAG COUNTER SPONGE SURGICOUNT (BAG) IMPLANT
BAG SPNG CNTER NS LX DISP (BAG)
BINDER ABDOMINAL 12 ML 46-62 (SOFTGOODS) IMPLANT
BLADE SURG SZ11 CARB STEEL (BLADE) ×2 IMPLANT
CABLE HIGH FREQUENCY MONO STRZ (ELECTRODE) ×2 IMPLANT
COVER SURGICAL LIGHT HANDLE (MISCELLANEOUS) ×2 IMPLANT
DERMABOND ADVANCED .7 DNX12 (GAUZE/BANDAGES/DRESSINGS) ×2 IMPLANT
DEVICE SECURE STRAP 25 ABSORB (INSTRUMENTS) IMPLANT
DRAPE INCISE IOBAN 66X45 STRL (DRAPES) ×2 IMPLANT
ELECT REM PT RETURN 15FT ADLT (MISCELLANEOUS) ×2 IMPLANT
GLOVE BIO SURGEON STRL SZ7 (GLOVE) ×2 IMPLANT
GLOVE BIOGEL PI IND STRL 7.0 (GLOVE) ×2 IMPLANT
GLOVE BIOGEL PI IND STRL 7.5 (GLOVE) ×2 IMPLANT
GOWN STRL REUS W/ TWL LRG LVL3 (GOWN DISPOSABLE) ×4 IMPLANT
GOWN STRL REUS W/ TWL XL LVL3 (GOWN DISPOSABLE) ×2 IMPLANT
GOWN STRL REUS W/TWL LRG LVL3 (GOWN DISPOSABLE) ×2
GOWN STRL REUS W/TWL XL LVL3 (GOWN DISPOSABLE) ×1
GRASPER SUT TROCAR 14GX15 (MISCELLANEOUS) IMPLANT
IRRIG SUCT STRYKERFLOW 2 WTIP (MISCELLANEOUS)
IRRIGATION SUCT STRKRFLW 2 WTP (MISCELLANEOUS) IMPLANT
KIT BASIN OR (CUSTOM PROCEDURE TRAY) ×2 IMPLANT
KIT TURNOVER KIT A (KITS) IMPLANT
MARKER SKIN DUAL TIP RULER LAB (MISCELLANEOUS) ×2 IMPLANT
MESH VENTRALIGHT ST 6IN CRC (Mesh General) IMPLANT
NDL SPNL 22GX3.5 QUINCKE BK (NEEDLE) ×2 IMPLANT
NEEDLE SPNL 22GX3.5 QUINCKE BK (NEEDLE) ×1 IMPLANT
NS IRRIG 1000ML POUR BTL (IV SOLUTION) ×2 IMPLANT
PAD POSITIONING PINK XL (MISCELLANEOUS) IMPLANT
PENCIL SMOKE EVACUATOR (MISCELLANEOUS) IMPLANT
PROTECTOR NERVE ULNAR (MISCELLANEOUS) IMPLANT
SCISSORS LAP 5X35 DISP (ENDOMECHANICALS) ×2 IMPLANT
SET TUBE SMOKE EVAC HIGH FLOW (TUBING) ×2 IMPLANT
SLEEVE Z-THREAD 5X100MM (TROCAR) IMPLANT
SOLUTION ANTFG W/FOAM PAD STRL (MISCELLANEOUS) ×2 IMPLANT
SPIKE FLUID TRANSFER (MISCELLANEOUS) ×2 IMPLANT
STAPLER VISISTAT 35W (STAPLE) ×2 IMPLANT
STRIP CLOSURE SKIN 1/2X4 (GAUZE/BANDAGES/DRESSINGS) IMPLANT
SUT MNCRL AB 4-0 PS2 18 (SUTURE) ×2 IMPLANT
SUT NOVA 0 T19/GS 22DT (SUTURE) IMPLANT
SUT PROLENE 0 CT 1 CR/8 (SUTURE) IMPLANT
SUT PROLENE 2 0 SH DA (SUTURE) IMPLANT
SUT VIC AB 3-0 SH 27 (SUTURE) ×1
SUT VIC AB 3-0 SH 27X BRD (SUTURE) IMPLANT
TACKER 5MM HERNIA 3.5CML NAB (ENDOMECHANICALS) ×2 IMPLANT
TAPE CLOTH 4X10 WHT NS (GAUZE/BANDAGES/DRESSINGS) IMPLANT
TAPE STRIPS DRAPE STRL (GAUZE/BANDAGES/DRESSINGS) IMPLANT
TOWEL OR 17X26 10 PK STRL BLUE (TOWEL DISPOSABLE) ×2 IMPLANT
TOWEL OR NON WOVEN STRL DISP B (DISPOSABLE) ×2 IMPLANT
TRAY FOLEY MTR SLVR 14FR STAT (SET/KITS/TRAYS/PACK) IMPLANT
TRAY FOLEY MTR SLVR 16FR STAT (SET/KITS/TRAYS/PACK) IMPLANT
TRAY LAPAROSCOPIC (CUSTOM PROCEDURE TRAY) ×2 IMPLANT
TROCAR 11X100 Z THREAD (TROCAR) ×2 IMPLANT
TROCAR BALLN 12MMX100 BLUNT (TROCAR) IMPLANT

## 2022-12-26 NOTE — Interval H&P Note (Signed)
History and Physical Interval Note:  12/26/2022 7:04 AM  Cathy Kirby  has presented today for surgery, with the diagnosis of INCISIONAL HERNIA.  The various methods of treatment have been discussed with the patient and family. After consideration of risks, benefits and other options for treatment, the patient has consented to  Procedure(s): [place] (N/A) as a surgical intervention.  The patient's history has been reviewed, patient examined, no change in status, stable for surgery.  I have reviewed the patient's chart and labs.  Questions were answered to the patient's satisfaction.     Rolm Bookbinder

## 2022-12-26 NOTE — Anesthesia Preprocedure Evaluation (Addendum)
Anesthesia Evaluation  Patient identified by MRN, date of birth, ID band Patient awake    Reviewed: Allergy & Precautions, NPO status , Patient's Chart, lab work & pertinent test results  History of Anesthesia Complications (+) PONV and history of anesthetic complications  Airway Mallampati: I  TM Distance: >3 FB Neck ROM: Full    Dental no notable dental hx. (+) Teeth Intact, Dental Advisory Given   Pulmonary neg pulmonary ROS   Pulmonary exam normal breath sounds clear to auscultation       Cardiovascular negative cardio ROS Normal cardiovascular exam Rhythm:Regular Rate:Normal     Neuro/Psych negative neurological ROS  negative psych ROS   GI/Hepatic Neg liver ROS,GERD  ,,  Endo/Other  negative endocrine ROS    Renal/GU negative Renal ROS  negative genitourinary   Musculoskeletal  (+) Arthritis ,    Abdominal   Peds  Hematology negative hematology ROS (+)   Anesthesia Other Findings   Reproductive/Obstetrics                             Anesthesia Physical Anesthesia Plan  ASA: 2  Anesthesia Plan: General and Regional   Post-op Pain Management: Tylenol PO (pre-op)* and Regional block*   Induction: Intravenous  PONV Risk Score and Plan: 4 or greater and Midazolam, Dexamethasone, Ondansetron and Scopolamine patch - Pre-op  Airway Management Planned: Oral ETT  Additional Equipment:   Intra-op Plan:   Post-operative Plan: Extubation in OR  Informed Consent: I have reviewed the patients History and Physical, chart, labs and discussed the procedure including the risks, benefits and alternatives for the proposed anesthesia with the patient or authorized representative who has indicated his/her understanding and acceptance.     Dental advisory given  Plan Discussed with: CRNA  Anesthesia Plan Comments:        Anesthesia Quick Evaluation

## 2022-12-26 NOTE — Anesthesia Procedure Notes (Signed)
Anesthesia Regional Block: Quadratus lumborum   Pre-Anesthetic Checklist: , timeout performed,  Correct Patient, Correct Site, Correct Laterality,  Correct Procedure, Correct Position, site marked,  Risks and benefits discussed,  Pre-op evaluation,  At surgeon's request and post-op pain management  Laterality: Left  Prep: Maximum Sterile Barrier Precautions used, chloraprep       Needles:  Injection technique: Single-shot  Needle Type: Echogenic Stimulator Needle     Needle Length: 9cm  Needle Gauge: 21     Additional Needles:   Procedures:,,,, ultrasound used (permanent image in chart),,    Narrative:  Start time: 12/26/2022 7:15 AM End time: 12/26/2022 7:21 AM Injection made incrementally with aspirations every 5 mL. Anesthesiologist: Freddrick March, MD

## 2022-12-26 NOTE — Anesthesia Procedure Notes (Signed)
Procedure Name: Intubation Date/Time: 12/26/2022 7:47 AM  Performed by: Lind Covert, CRNAPre-anesthesia Checklist: Patient identified, Emergency Drugs available, Suction available, Patient being monitored and Timeout performed Patient Re-evaluated:Patient Re-evaluated prior to induction Oxygen Delivery Method: Circle system utilized Preoxygenation: Pre-oxygenation with 100% oxygen Induction Type: IV induction Ventilation: Mask ventilation without difficulty Laryngoscope Size: Glidescope and 4 Grade View: Grade II Tube type: Oral Tube size: 7.0 mm Number of attempts: 4 Airway Equipment and Method: Rigid stylet and Video-laryngoscopy Placement Confirmation: ETT inserted through vocal cords under direct vision, positive ETCO2 and breath sounds checked- equal and bilateral Secured at: 21 cm Dental Injury: Teeth and Oropharynx as per pre-operative assessment  Difficulty Due To: Difficulty was unanticipated and Difficult Airway- due to anterior larynx Future Recommendations: Recommend- induction with short-acting agent, and alternative techniques readily available Comments: Attempts x2 by A. Erick Blinks with MAC 3 and MAC 4 blade. No view of vocal cords. Attempt x1 by CRNA with MAC 4. Unable to pass ETT through vocal cords. Attempt x1 with Glidescope by CRNA successful.

## 2022-12-26 NOTE — Anesthesia Procedure Notes (Signed)
Anesthesia Regional Block: Quadratus lumborum   Pre-Anesthetic Checklist: , timeout performed,  Correct Patient, Correct Site, Correct Laterality,  Correct Procedure, Correct Position, site marked,  Risks and benefits discussed,  Pre-op evaluation,  At surgeon's request and post-op pain management  Laterality: Right  Prep: Maximum Sterile Barrier Precautions used, chloraprep       Needles:  Injection technique: Single-shot  Needle Type: Echogenic Stimulator Needle     Needle Length: 9cm  Needle Gauge: 21     Additional Needles:   Procedures:,,,, ultrasound used (permanent image in chart),,    Narrative:  Start time: 12/26/2022 7:21 AM End time: 12/26/2022 7:25 AM Injection made incrementally with aspirations every 5 mL. Anesthesiologist: Freddrick March, MD

## 2022-12-26 NOTE — Discharge Instructions (Signed)
[facility] LAPAROSCOPIC SURGERY: POST OP INSTRUCTIONS  A prescription for pain medication may be given to you upon discharge.  Take your pain medication as prescribed, if needed.  If narcotic pain medicine is not needed, then you may take acetaminophen (Tylenol), naprosyn (Alleve), or ibuprofen (Advil) as needed. Take your usually prescribed medications unless otherwise directed. If you need a refill on your pain medication, please contact your pharmacy.  They will contact our office to request authorization. Prescriptions will not be filled after 5pm or on week-ends. You should follow a light diet the first few days after arrival home, such as soup and crackers, etc.  Be sure to include lots of fluids daily. Most patients will experience some swelling and bruising in the area of the incisions.  Ice packs will help.  Swelling and bruising can take several days to resolve.  It is common to experience some constipation if taking pain medication after surgery.  Increasing fluid intake and taking a stool softener (such as Colace) will usually help or prevent this problem from occurring.  A mild laxative (Milk of Magnesia or Miralax) should be taken according to package instructions if there are no bowel movements after 48 hours. Unless discharge instructions indicate otherwise, you may remove your bandages 48 hours after surgery, and you may shower at that time.  You may have steri-strips (small skin tapes) in place directly over the incision.  These strips should be left on the skin for 7-10 days.  If your surgeon used skin glue on the incision, you may shower in 24 hours.  The glue will flake off over the next 2-3 weeks.  Any sutures or staples will be removed at the office during your follow-up visit. ACTIVITIES:  You may resume regular (light) daily activities beginning the next day--such as daily self-care, walking, climbing stairs--gradually  increasing activities as tolerated.  You may have sexual intercourse when it is comfortable.  Refrain from any heavy lifting or straining until approved by your doctor. You may drive when you are no longer taking prescription pain medication, you can comfortably wear a seatbelt, and you can safely maneuver your car and apply brakes. RETURN TO WORK:  __________________________________________________________ Dennis Bast should see your doctor in the office for a follow-up appointment approximately 2-3 weeks after your surgery.  Make sure that you call for this appointment within a day or two after you arrive home to insure a convenient appointment time. OTHER INSTRUCTIONS: __________________________________________________________________________________________________________________________ __________________________________________________________________________________________________________________________ WHEN TO CALL YOUR DOCTOR: Fever over 101.0 Inability to urinate Continued bleeding from incision. Increased pain, redness, or drainage from the incision. Increasing abdominal pain  The clinic staff is available to answer your questions during regular business hours.  Please don't hesitate to call and ask to speak to one of the nurses for clinical concerns.  If you have a medical emergency, go to the nearest emergency room or call 911.  A surgeon from [facility] is always on call at the hospital. [address] [place], [place], [place]  [address] ? [address], [place], [place]   [id number] [phone] ? [phone] ? FAX [phone] Web site: [email or web address]

## 2022-12-26 NOTE — H&P (Signed)
  57 year old female who I know from May 2022 when she presented with acute cholecystitis. I did a laparoscopic cholecystectomy. She had a pre-existing small umbilical hernia that I fixed with suture at the same time. She has some abdominal discomfort that surrounds her umbilicus that occurs over most days. She does feel a lump that she is able to reduce just below her umbilicus. She is having bowel movements. She has variable loose stools. She has no nausea or vomiting. We did a CT scan that shows a small fat-containing umbilical hernia and an additional moderate size ventral hernia containing some nondilated bowel inferior to the umbilicus. This measures in total about 4.1 x 3.6 cm. There were right next to each other. She had an abnormality on her CT scan that underwent an MRI that showed no real abnormality that was concerning. This is continued to bother her and she would like to discuss repair.  Medical History: Past Medical History: Diagnosis Date Anxiety Arthritis GERD (gastroesophageal reflux disease)  There is no problem list on file for this patient.  Past Surgical History: Procedure Laterality Date CHOLECYSTECTOMY  No Known Allergies  Current Outpatient Medications on File Prior to Visit Medication Sig Dispense Refill cetirizine (ZYRTEC) 10 MG tablet Take 10 mg by mouth once daily FLUoxetine (PROZAC) 20 MG capsule Take 1 capsule by mouth once daily multivitamin capsule Take 1 capsule by mouth once daily omega-3-dha-epa-fish oil 300-1,000 mg capsule Take by mouth  Family History Problem Relation Age of Onset Skin cancer Father Diabetes Father   Social History  Tobacco Use Smoking Status Never Smokeless Tobacco Never Marital status: Married Tobacco Use Smoking status: Never Smokeless tobacco: Never Substance and Sexual Activity Alcohol use: Yes Drug use: Not Currently  Objective:  Vitals: 12/03/22 1125 12/03/22 1129 Weight: 92.5 kg (204 lb) Height: 157.5 cm  (5\' 2" ) PainSc: 3  Body mass index is 37.31 kg/m.  Physical Exam Constitutional: Appearance: Normal appearance. Abdominal: General: There is no distension. Tenderness: There is no abdominal tenderness. Hernia: A hernia is present. Hernia is present in the umbilical area. Comments: Incisional hernia just below umbilicus that is reducible, mildly tender,   Assessment and Plan:  Incisional hernia, without obstruction or gangrene  Laparoscopic assisted incisional hernia repair with mesh  We discussed the hernia. I do think it is time to repair this. It is quite symptomatic for her and is limiting some of her activity at this point. We discussed a laparoscopic assisted repair with mesh. We discussed mesh and the issues that can occur with mesh. I drew a diagram and showed her where the multiple laparoscopic incisions would be and where the mesh would be placed. I also think it be reasonable to do this in an assisted fashion and make an incision at her umbilicus to debride the sac as well as to close the fascia. We discussed the risks including but not limited to bleeding, infection, recurrence, injury requiring additional surgery, reoperation, open procedure. We are going to try to send her home the same day. We discussed there certainly are some operative findings that I might keep her overnight. We are agreeable to proceeding.

## 2022-12-26 NOTE — Op Note (Signed)
Preoperative diagnosis: Incisional hernia and Postoperative diagnosis: Same as above Procedure: Laparoscopic incisional hernia repair with 15 cm round Ventralight mesh Surgeon: Dr. Serita Grammes Anesthesia: General with bilateral tap blocks Estimated blood loss: Minimal Complications: Drains: Specimens: None Special count was correct completion Disposition recovery stable condition  Indications: This is a 57 year old female who had I did a laparoscopic cholecystectomy on 05/20/2021.  At that time she had a pre-existing small umbilical hernia that I fixed with suture.  She has recently then developed a moderately sized incisional hernia at that site.  On a CT scan this measures about 4 x 3.6 cm with a very small defect right above that.  We discussed multiple options and elected to proceed with a laparoscopic repair with mesh.  Procedure: After informed consent was obtained she was taken to the operating room.  She had undergone bilateral tap blocks.  She was given antibiotics.  SCDs were placed.  She was placed under general anesthesia.  It was difficult to intubate her due to her anterior airway and she did receive some Decadron.  She then had a Foley catheter placed.  She was then prepped and draped in a sterile sterile surgical fashion.  Surgical timeout was then performed.  I did place Ioban over her abdomen.  I then made a small incision in the left upper quadrant.  I then entered into the abdomen with a 5 mm direct optical entry trocar without injury.  The abdomen was then insufflated.  I then placed a 12 mm trocar in the left mid abdomen and a 5 mm trocar in the lower abdomen.  Eventually I placed two 5 mm trocars on the right side.  She had some scar tissue adherent to her abdominal wall that I released with scissors.  She had no bowel in her hernia at this point as she did on her CT scan.  The omentum was adherent.  I was able to manually reduce this and used scissors to excise some of the  scar tissue.  This was about 5 x 4 cm in biggest dimension then used a 15 cm round Ventralight ST mesh.  I placed 0 Prolene sutures at the cardinal positions around the mesh.  I then rolled this up and placed it in the abdomen.  I rolled it flat.  I then measured out to where the hernia was in the middle of the mesh.  I brought my sutures up through separate stab incisions and pulled this up to the abdominal wall.  This appeared to appropriately cover the hernia.  I then used the secure strap tacker to secure this to the abdominal wall.  This was in good position.  Due to having a very large hernia sac I then made a curvilinear incision below her umbilicus.  I then debrided most of the sac and I used #1 Novofils to close the native fascia over this.  I then closed this with 3-0 Vicryl for Monocryl.  I then looked at the mesh again this was in good position.  There is no evidence of any bleeding.  I then removed all the trocars after desufflated the abdomen.  These were all closed with 4-0 Monocryl and glue.  She tolerated this well was extubated transferred recovery stable.

## 2022-12-26 NOTE — Transfer of Care (Signed)
Immediate Anesthesia Transfer of Care Note  Patient: Cathy Kirby  Procedure(s) Performed: LAPAROSCOPIC INCISIONAL HERNIA REPAIR WITH MESH  Patient Location: PACU  Anesthesia Type:General  Level of Consciousness: sedated  Airway & Oxygen Therapy: Patient Spontanous Breathing and Patient connected to face mask oxygen  Post-op Assessment: Report given to RN and Post -op Vital signs reviewed and stable  Post vital signs: Reviewed and stable  Last Vitals:  Vitals Value Taken Time  BP 134/77 12/26/22 0930  Temp    Pulse 76 12/26/22 0933  Resp 10 12/26/22 0933  SpO2 98 % 12/26/22 0933  Vitals shown include unvalidated device data.  Last Pain: There were no vitals filed for this visit.       Complications:  Encounter Notable Events  Notable Event Outcome Phase Comment  Difficult to intubate - unexpected  Intraprocedure Filed from anesthesia note documentation.

## 2022-12-27 ENCOUNTER — Encounter (HOSPITAL_COMMUNITY): Payer: Self-pay | Admitting: General Surgery

## 2022-12-27 NOTE — Anesthesia Postprocedure Evaluation (Signed)
Anesthesia Post Note  Patient: Charmine L Motta-Payne  Procedure(s) Performed: Walker WITH MESH     Patient location during evaluation: PACU Anesthesia Type: General and Regional Level of consciousness: awake and alert Pain management: pain level controlled Vital Signs Assessment: post-procedure vital signs reviewed and stable Respiratory status: spontaneous breathing, nonlabored ventilation, respiratory function stable and patient connected to nasal cannula oxygen Cardiovascular status: blood pressure returned to baseline and stable Postop Assessment: no apparent nausea or vomiting Anesthetic complications: yes  Encounter Notable Events  Notable Event Outcome Phase Comment  Difficult to intubate - unexpected  Intraprocedure Filed from anesthesia note documentation.    Last Vitals:  Vitals:   12/26/22 1045 12/26/22 1100  BP: (!) 108/58 104/62  Pulse: 76 71  Resp:    Temp:    SpO2: 95% 97%    Last Pain:  Vitals:   12/26/22 1100  PainSc: 6                  Jontavious Commons L [place]

## 2023-01-07 ENCOUNTER — Other Ambulatory Visit: Payer: Self-pay | Admitting: Radiology

## 2023-01-07 DIAGNOSIS — N760 Acute vaginitis: Secondary | ICD-10-CM

## 2023-01-08 NOTE — Telephone Encounter (Signed)
AEX 04/11/2022

## 2023-04-17 ENCOUNTER — Ambulatory Visit: Payer: BC Managed Care – PPO | Admitting: Radiology

## 2023-05-18 ENCOUNTER — Other Ambulatory Visit: Payer: Self-pay | Admitting: Radiology

## 2023-05-20 NOTE — Telephone Encounter (Signed)
Med refill request: Prozac Last AEX: 04/11/22 Next AEX: 05/31/23 Last MMG (if hormonal med) 12/19/21 Breast Density Cat B, BI-RADS CAT 4 sus Refill authorized: Please Advise, #[phone], 4 RF

## 2023-05-31 ENCOUNTER — Ambulatory Visit: Payer: BC Managed Care – PPO | Admitting: Radiology

## 2023-05-31 ENCOUNTER — Encounter: Payer: Self-pay | Admitting: Radiology

## 2023-05-31 VITALS — BP 100/62 | Ht 61.75 in | Wt 204.0 lb

## 2023-05-31 DIAGNOSIS — Z01419 Encounter for gynecological examination (general) (routine) without abnormal findings: Secondary | ICD-10-CM

## 2023-05-31 DIAGNOSIS — F419 Anxiety disorder, unspecified: Secondary | ICD-10-CM

## 2023-05-31 DIAGNOSIS — Z79890 Hormone replacement therapy (postmenopausal): Secondary | ICD-10-CM

## 2023-05-31 DIAGNOSIS — N904 Leukoplakia of vulva: Secondary | ICD-10-CM

## 2023-05-31 DIAGNOSIS — N905 Atrophy of vulva: Secondary | ICD-10-CM

## 2023-05-31 MED ORDER — FLUOXETINE HCL 20 MG PO CAPS
20.0000 mg | ORAL_CAPSULE | Freq: Every day | ORAL | 4 refills | Status: DC
Start: 2023-05-31 — End: 2024-06-03

## 2023-05-31 MED ORDER — ESTRADIOL 0.1 MG/GM VA CREA: 1.0000 g | TOPICAL_CREAM | VAGINAL | 12 refills | Status: DC

## 2023-05-31 MED ORDER — ESTRADIOL 0.025 MG/24HR TD PTTW: 1.0000 | MEDICATED_PATCH | TRANSDERMAL | 0 refills | Status: DC

## 2023-05-31 MED ORDER — CLOBETASOL PROPIONATE 0.05 % EX CREA
1.0000 | TOPICAL_CREAM | CUTANEOUS | 4 refills | Status: DC
Start: 2023-06-03 — End: 2024-06-03

## 2023-05-31 MED ORDER — PROGESTERONE MICRONIZED 100 MG PO CAPS: 100.0000 mg | ORAL_CAPSULE | Freq: Every day | ORAL | 0 refills | Status: DC

## 2023-05-31 NOTE — Progress Notes (Signed)
Eden L Motta-Payne 08/20/1966 [id number]   History: Postmenopausal 57 y.o. presents for annual exam. Interested in starting HRT for the health benefits. Hot flashes have returned since her hernia surgery 6 months ago.Continue to use vaginal estrogen and clobetasol with good results. Doing well on prozac, needs refills.Established with new therapist. Labs with PCP   Gynecologic History Postmenopausal Last Pap: 2023. Results were: normal Last mammogram: 2023. Results were: abnormal with benign biopsy, overdue for follow up mammogram Last colonoscopy: unsure, pt states up to date   Obstetric History OB History  Gravida Para Term Preterm AB Living  4 2     1 2   SAB IAB Ectopic Multiple Live Births      1        # Outcome Date GA Lbr Len/2nd Weight Sex Type Anes PTL Lv  4 Para           3 Para           2 Ectopic           1 Gravida              The following portions of the patient's history were reviewed and updated as appropriate: allergies, current medications, past family history, past medical history, past social history, past surgical history, and problem list.  Review of Systems Pertinent items noted in HPI and remainder of comprehensive ROS otherwise negative.  Past medical history, past surgical history, family history and social history were all reviewed and documented in the EPIC chart.  Exam:  Vitals:   05/31/23 1155  BP: 100/62  Weight: 204 lb (92.5 kg)  Height: 5' 1.75" (1.568 m)   Body mass index is 37.61 kg/m.  General appearance:  Normal Thyroid:  Symmetrical, normal in size, without palpable masses or nodularity. Respiratory  Auscultation:  Clear without wheezing or rhonchi Cardiovascular  Auscultation:  Regular rate, without rubs, murmurs or gallops  Edema/varicosities:  Not grossly evident Abdominal  Soft,nontender, without masses, guarding or rebound.  Liver/spleen:  No organomegaly noted  Hernia:  None appreciated  Skin  Inspection:   Grossly normal Breasts: Examined lying and sitting.   Right: Without masses, retractions, nipple discharge or axillary adenopathy.   Left: Without masses, retractions, nipple discharge or axillary adenopathy. Genitourinary   Inguinal/mons:  Normal without inguinal adenopathy  External genitalia:  Lichen sclerosis with loss of architecture, currently well managed. No white plaques present  BUS/Urethra/Skene's glands:  Normal  Vagina:  Normal appearing with normal color and discharge, no lesions. Atrophy: mild, improving   Cervix:  Normal appearing without discharge or lesions  Uterus:  Normal in size, shape and contour.  Midline and mobile, nontender  Adnexa/parametria:     Rt: Normal in size, without masses or tenderness.   Lt: Normal in size, without masses or tenderness.  Anus and perineum: Normal    Raynelle Fanning, CMA present for exam  Assessment/Plan:   1. Well woman exam with routine gynecological exam Pap normal 2023 Mammogram overdue for clip placement, will call to schedule  2. Hormone replacement therapy (HRT) Interested in trying HRT, risks and benefits reviewed. Discussed the importance of taking progesterone nightly to prevent any bleeding. Will have pt follow up in 3 months for med check - estradiol (VIVELLE-DOT) 0.025 MG/24HR; Place 1 patch onto the skin 2 (two) times a week.  Dispense: 24 patch; Refill: 0 - progesterone (PROMETRIUM) 100 MG capsule; Take 1 capsule (100 mg total) by mouth daily.  Dispense: 90 capsule; Refill:  0  3. Vulvar atrophy - estradiol (ESTRACE VAGINAL) 0.1 MG/GM vaginal cream; Place 1 g vaginally 2 (two) times a week. Apply 1 applicatorful to the vulva 3 times a week at bedtime  Dispense: 42.5 g; Refill: 12  4. Anxiety - FLUoxetine (PROZAC) 20 MG capsule; Take 1 capsule (20 mg total) by mouth daily.  Dispense: 90 capsule; Refill: 4  5. Lichen sclerosus et atrophicus of the vulva - clobetasol cream (TEMOVATE) 0.05 %; Apply 1 Application topically  2 (two) times a week.  Dispense: 30 g; Refill: 4    Discussed SBE, colonoscopy and DEXA screening as directed. Recommend of exercise weekly, including weight bearing exercise. Encouraged the use of seatbelts and sunscreen.  Return in 1 year for annual or sooner prn.  Tanda Rockers WHNP-BC, 12:39 PM 05/31/2023

## 2023-07-05 ENCOUNTER — Other Ambulatory Visit: Payer: Self-pay | Admitting: Family Medicine

## 2023-07-05 DIAGNOSIS — E782 Mixed hyperlipidemia: Secondary | ICD-10-CM

## 2023-07-10 ENCOUNTER — Ambulatory Visit
Admission: RE | Admit: 2023-07-10 | Discharge: 2023-07-10 | Disposition: A | Discharge status: Home / Self Care | Payer: BC Managed Care – PPO | Source: Ambulatory Visit | Attending: Family Medicine | Admitting: Family Medicine

## 2023-07-10 DIAGNOSIS — E782 Mixed hyperlipidemia: Secondary | ICD-10-CM | POA: Insufficient documentation

## 2023-08-17 ENCOUNTER — Other Ambulatory Visit: Payer: Self-pay | Admitting: Radiology

## 2023-08-17 DIAGNOSIS — Z79890 Hormone replacement therapy (postmenopausal): Secondary | ICD-10-CM

## 2023-08-19 NOTE — Telephone Encounter (Signed)
Med refill request: Vivelle-dot Last AEX: 05/31/23 Next OV:09/10/23 Last MMG (if hormonal med) 12/19/21 Refill authorized: Please Advise, [phone], 0 RF

## 2023-08-26 ENCOUNTER — Other Ambulatory Visit: Payer: Self-pay | Admitting: Radiology

## 2023-08-26 DIAGNOSIS — Z79890 Hormone replacement therapy (postmenopausal): Secondary | ICD-10-CM

## 2023-08-27 NOTE — Telephone Encounter (Signed)
Med refill request:  progesterone 100 mg Last AEX: 05/31/23 Next OV: 09/10/23  Last MMG (if hormonal med) 12/19/21 Refill authorized:  progesterone 100mg  with 3 refills.  Sent to provider for review.

## 2023-09-09 ENCOUNTER — Ambulatory Visit (INDEPENDENT_AMBULATORY_CARE_PROVIDER_SITE_OTHER): Payer: BC Managed Care – PPO | Admitting: Nurse Practitioner

## 2023-09-09 VITALS — BP 112/80 | HR 90

## 2023-09-09 DIAGNOSIS — Z79890 Hormone replacement therapy (postmenopausal): Secondary | ICD-10-CM

## 2023-09-09 MED ORDER — ESTRADIOL 0.025 MG/24HR TD PTTW
1.0000 | MEDICATED_PATCH | TRANSDERMAL | 0 refills | Status: DC
Start: 2023-09-09 — End: 2023-09-30

## 2023-09-09 NOTE — Progress Notes (Signed)
   Acute Office Visit  Subjective:    Patient ID: Cathy Kirby, female    DOB: 12-20-1965, 57 y.o.   MRN: [id number]   HPI 57 y.o. presents today for 96-month follow up for HRT. HRT prescribed in September by Chelsea Primus, NP for hot flashes and health benefits. Using vaginal estrogen as well. Started Estradiol 0.025 mg patch twice weekly, Prometrium 100 mg nightly. Has forgotten to take Prometrium some nights and will take the next morning when she remembers. Has noticed improvement in hot flashes and energy levels. Happy with current dose.   Patient's last menstrual period was 02/27/2022 (approximate).    Review of Systems  Constitutional: Negative.   Genitourinary: Negative.        Objective:    Physical Exam Constitutional:      Appearance: Normal appearance.   GU: Not indicated  BP 112/80   Pulse 90   LMP 02/27/2022 (Approximate)   SpO2 98%  Wt Readings from Last 3 Encounters:  05/31/23 204 lb (92.5 kg)  12/26/22 199 lb (90.3 kg)  12/18/22 199 lb (90.3 kg)       Assessment & Plan:   Problem List Items Addressed This Visit   None Visit Diagnoses     Postmenopausal hormone therapy    -  Primary   Relevant Medications   estradiol (VIVELLE-DOT) 0.025 MG/24HR      Plan: Doing well on current dose. Consistent use of Prometrium encouraged, also best to take at night to help with sleep and avoid daytime sleepiness. Again reviewed risks and benefits of use. All questions answered. Refills provided on patch. Does not need refills on Prometrium at this time.   Return if symptoms worsen or fail to improve.    Olivia Mackie DNP, 9:03 AM 09/09/2023

## 2023-09-10 ENCOUNTER — Ambulatory Visit: Payer: BC Managed Care – PPO | Admitting: Radiology

## 2023-09-20 NOTE — Progress Notes (Signed)
 Assessment/Plan:    1.  Essential Tremor  -DaT scan in 2022 was normal  -we discussed various tx's but ultimately she isn't interested/not ready for medication.  -discussed nature/pathophysiology of ET  Subjective:   Cathy Kirby was seen today in follow up.  I have only seen the patient 1 other time and that was about 2-1/2 years ago.  At that point in time, tremor was quite mild and most of the tremors that were seen were seen on videos that she brought with her.  We ended up doing DaTscan  which was normal, as expected.  She reports today that tremor is just a bit increased, esp if stressed or tired.  It doesn't usually interfere with life but does occasionally interfere with work on the computer.  She has no trouble with ADL's.  She sees it with eating/drinking but it doesn't interfere with those tasks.  Wanted to f/u to keep care established.  Current prescribed movement disorder medications: N/a   PREVIOUS MEDICATIONS: none to date  ALLERGIES:  No Known Allergies  CURRENT MEDICATIONS:  Current Meds  Medication Sig   acetaminophen  (TYLENOL ) 500 MG tablet Take 1 tablet (500 mg total) by mouth every 6 (six) hours as needed for mild pain, fever or headache.   bismuth subsalicylate (PEPTO BISMOL) 262 MG chewable tablet Chew 524 mg by mouth as needed for indigestion or diarrhea or loose stools.   BLACK COHOSH PO Take 1 capsule by mouth daily.   cetirizine (ZYRTEC) 10 MG tablet Take 10 mg by mouth daily.   cholecalciferol (VITAMIN D3) 25 MCG (1000 UNIT) tablet 1 tablet Orally once a day   estradiol  (ESTRACE  VAGINAL) 0.1 MG/GM vaginal cream Place 1 g vaginally 2 (two) times a week. Apply 1 applicatorful to the vulva 3 times a week at bedtime   estradiol  (VIVELLE -DOT) 0.025 MG/24HR PLACE 1 PATCH ONTO THE SKIN 2 TIMES A WEEK.   famotidine  (PEPCID ) 10 MG tablet Take 10 mg by mouth at bedtime.   FLUoxetine  (PROZAC ) 20 MG capsule Take 1 capsule (20 mg total) by mouth daily.    ibuprofen  (ADVIL ) 600 MG tablet Take 1 tablet (600 mg total) by mouth every 8 (eight) hours as needed for moderate pain or cramping.   Multiple Vitamin (MULTIVITAMIN) capsule Take 1 capsule by mouth daily.   nystatin -triamcinolone  ointment (MYCOLOG) APPLY TO AFFECTED AREA TWICE A DAY   Omega-3 Fatty Acids (FISH OIL) 1200 MG CAPS Take 1,200 mg by mouth daily.   Probiotic Product (PROBIOTIC PO) Take 1 capsule by mouth daily.   progesterone  (PROMETRIUM ) 100 MG capsule TAKE 1 CAPSULE BY MOUTH EVERY DAY   Red Yeast Rice Extract (RED YEAST RICE PO) Take 1 capsule by mouth daily.    Objective:   PHYSICAL EXAMINATION:    VITALS:   Vitals:   10/01/23 0754  BP: 114/76  Pulse: 92  Resp: 20  SpO2: 98%  Weight: 207 lb (93.9 kg)  Height: 5' 2 (1.575 m)    GEN:  The patient appears stated age and is in NAD. HEENT:  Normocephalic, atraumatic.  The mucous membranes are moist.  CV:  RRR Lungs:  CTAB   Neurological examination:  Orientation: The patient is alert and oriented x3. Cranial nerves: There is good facial symmetry. The speech is fluent and clear. Hearing is intact to conversational tone. Sensation: Sensation is intact to light touch throughout Motor: Strength is at least antigravity x4.  Movement examination: Tone: There is normal tone in the UE/LE  Abnormal movements: There is no rest tremor.  No significant postural tremor.  There is no significant intention tremor until she is given a weight and then it is seen mild b/l.   Does well pouring water from 1 glass to another.  she has no significant difficulty with archimedes spirals.   Gait and Station: The patient has no difficulty arising out of a deep-seated chair without the use of the hands. The patient's stride length is good I have reviewed and interpreted the following labs independently   Chemistry      Component Value Date/Time   NA 139 04/13/2021 0930   K 4.5 04/13/2021 0930   CL 105 04/13/2021 0930   CO2 27  04/13/2021 0930   BUN 14 04/13/2021 0930   CREATININE 0.80 04/13/2021 0930      Component Value Date/Time   CALCIUM 10.0 04/13/2021 0930   ALKPHOS 87 02/20/2021 0724   AST 17 04/13/2021 0930   ALT 22 04/13/2021 0930   BILITOT 0.5 04/13/2021 0930      Lab Results  Component Value Date   WBC 6.4 12/18/2022   HGB 13.4 12/18/2022   HCT 40.1 12/18/2022   MCV 85.9 12/18/2022   PLT 262 12/18/2022   Lab Results  Component Value Date   TSH 1.65 04/13/2021     Chemistry      Component Value Date/Time   NA 139 04/13/2021 0930   K 4.5 04/13/2021 0930   CL 105 04/13/2021 0930   CO2 27 04/13/2021 0930   BUN 14 04/13/2021 0930   CREATININE 0.80 04/13/2021 0930      Component Value Date/Time   CALCIUM 10.0 04/13/2021 0930   ALKPHOS 87 02/20/2021 0724   AST 17 04/13/2021 0930   ALT 22 04/13/2021 0930   BILITOT 0.5 04/13/2021 0930        Cc:  Pridgen, Taylar, NP

## 2023-09-29 ENCOUNTER — Other Ambulatory Visit: Payer: Self-pay | Admitting: Nurse Practitioner

## 2023-09-29 DIAGNOSIS — Z79890 Hormone replacement therapy (postmenopausal): Secondary | ICD-10-CM

## 2023-09-30 NOTE — Telephone Encounter (Signed)
Med refill request: HRT Patch Last AEX: 05/31/2023-JC Next AEX: 06/03/2024-JC Last MMG (if hormonal med): 2023-WNL Refill authorized: rx pend.  Rx just sent for 64mo supply on 09/09/2023 w/ zero refills.   9yrs worth of prometrium sent on 08/27/2023

## 2023-10-01 ENCOUNTER — Ambulatory Visit (INDEPENDENT_AMBULATORY_CARE_PROVIDER_SITE_OTHER): Payer: BC Managed Care – PPO | Admitting: Neurology

## 2023-10-01 ENCOUNTER — Encounter: Payer: Self-pay | Admitting: Neurology

## 2023-10-01 VITALS — BP 114/76 | HR 92 | Resp 20 | Ht 62.0 in | Wt 207.0 lb

## 2023-10-01 DIAGNOSIS — G250 Essential tremor: Secondary | ICD-10-CM | POA: Diagnosis not present

## 2023-10-01 NOTE — Patient Instructions (Signed)

## 2023-11-20 ENCOUNTER — Other Ambulatory Visit: Payer: Self-pay

## 2023-11-20 MED ORDER — FLUCONAZOLE 150 MG PO TABS: 150.0000 mg | ORAL_TABLET | Freq: Once | ORAL | 0 refills | Status: AC

## 2023-11-20 NOTE — Telephone Encounter (Signed)
Ok to send fluconazole 150mg  po#2 3 days apart

## 2023-12-30 DIAGNOSIS — Z79890 Hormone replacement therapy (postmenopausal): Secondary | ICD-10-CM

## 2023-12-31 MED ORDER — ESTRADIOL 0.025 MG/24HR TD PTTW: 1.0000 | MEDICATED_PATCH | TRANSDERMAL | 1 refills | Status: DC

## 2023-12-31 NOTE — Telephone Encounter (Signed)
 Med refill request: HRT Patches Last AEX: 05/31/2023-JC Next AEX: 06/03/2024-JC Last MMG (if hormonal med): 2023-WNL Refill authorized: rx pend.

## 2024-01-01 ENCOUNTER — Encounter: Payer: Self-pay | Admitting: Radiology

## 2024-06-03 ENCOUNTER — Ambulatory Visit (INDEPENDENT_AMBULATORY_CARE_PROVIDER_SITE_OTHER): Payer: BC Managed Care – PPO | Admitting: Radiology

## 2024-06-03 ENCOUNTER — Encounter: Payer: Self-pay | Admitting: Radiology

## 2024-06-03 VITALS — BP 120/70 | HR 84 | Ht 62.21 in | Wt 210.8 lb

## 2024-06-03 DIAGNOSIS — Z1331 Encounter for screening for depression: Secondary | ICD-10-CM

## 2024-06-03 DIAGNOSIS — Z01419 Encounter for gynecological examination (general) (routine) without abnormal findings: Secondary | ICD-10-CM | POA: Diagnosis not present

## 2024-06-03 DIAGNOSIS — R300 Dysuria: Secondary | ICD-10-CM | POA: Diagnosis not present

## 2024-06-03 DIAGNOSIS — N905 Atrophy of vulva: Secondary | ICD-10-CM | POA: Diagnosis not present

## 2024-06-03 DIAGNOSIS — F419 Anxiety disorder, unspecified: Secondary | ICD-10-CM

## 2024-06-03 DIAGNOSIS — N904 Leukoplakia of vulva: Secondary | ICD-10-CM

## 2024-06-03 DIAGNOSIS — Z79890 Hormone replacement therapy (postmenopausal): Secondary | ICD-10-CM | POA: Diagnosis not present

## 2024-06-03 LAB — URINALYSIS, COMPLETE W/RFL CULTURE
Bacteria, UA: NONE SEEN /HPF
Bilirubin Urine: NEGATIVE
Glucose, UA: NEGATIVE
Hgb urine dipstick: NEGATIVE
Hyaline Cast: NONE SEEN /LPF
Ketones, ur: NEGATIVE
Leukocyte Esterase: NEGATIVE
Nitrites, Initial: NEGATIVE
Protein, ur: NEGATIVE
RBC / HPF: NONE SEEN /HPF (ref 0–2)
Specific Gravity, Urine: 1.015 (ref 1.001–1.035)
WBC, UA: NONE SEEN /HPF (ref 0–5)
pH: 6 (ref 5.0–8.0)

## 2024-06-03 LAB — NO CULTURE INDICATED

## 2024-06-03 MED ORDER — CLOBETASOL PROPIONATE 0.05 % EX CREA: 1.0000 | TOPICAL_CREAM | CUTANEOUS | 4 refills | Status: AC

## 2024-06-03 MED ORDER — FLUOXETINE HCL 20 MG PO CAPS: 20.0000 mg | ORAL_CAPSULE | Freq: Every day | ORAL | 4 refills | Status: AC

## 2024-06-03 MED ORDER — ESTRADIOL 0.1 MG/GM VA CREA: 1.0000 g | TOPICAL_CREAM | VAGINAL | 6 refills | Status: AC

## 2024-06-03 MED ORDER — PROGESTERONE MICRONIZED 100 MG PO CAPS: 100.0000 mg | ORAL_CAPSULE | Freq: Every evening | ORAL | 4 refills | Status: AC

## 2024-06-03 MED ORDER — ESTRADIOL 0.0375 MG/24HR TD PTTW: 1.0000 | MEDICATED_PATCH | TRANSDERMAL | 4 refills | Status: AC

## 2024-06-03 NOTE — Progress Notes (Signed)
 Cathy Kirby 09-26-1966 [id number]   History: Postmenopausal 58 y.o. presents for annual exam. Occasional burning with urination, notices after using clobetasol  for maintenance of LS twice weekly. Uses vaginal estrace  2-3 times a week. Doing well on HRT, open to increasing the dose for bone and heart health.    Gynecologic History Postmenopausal Last Pap: 2023. Results were: normal Last mammogram: 4/25. Results were: normal Last colonoscopy: unsure   Obstetric History OB History  Gravida Para Term Preterm AB Living  4 2   1 2   SAB IAB Ectopic Multiple Live Births    1      # Outcome Date GA Lbr Len/2nd Weight Sex Type Anes PTL Lv  4 Para           3 Para           2 Ectopic           1 Gravida                06/03/2024    8:58 AM  Depression screen PHQ 2/9  Decreased Interest 1  Down, Depressed, Hopeless 1  PHQ - 2 Score 2  Altered sleeping 0  Tired, decreased energy 1  Change in appetite 0  Feeling bad or failure about yourself  1  Trouble concentrating 0  Moving slowly or fidgety/restless 1  Suicidal thoughts 0  PHQ-9 Score 5  Difficult doing work/chores Somewhat difficult     The following portions of the patient's history were reviewed and updated as appropriate: allergies, current medications, past family history, past medical history, past social history, past surgical history, and problem list.  Review of Systems Pertinent items noted in HPI and remainder of comprehensive ROS otherwise negative.  Past medical history, past surgical history, family history and social history were all reviewed and documented in the EPIC chart.  Exam:  Vitals:   06/03/24 0855  BP: 120/70  Pulse: 84  SpO2: 95%  Weight: 210 lb 12.8 oz (95.6 kg)  Height: 5' 2.21 (1.58 m)   Body mass index is 38.3 kg/m.  General appearance:  Normal Thyroid:  Symmetrical, normal in size, without palpable masses or nodularity. Respiratory  Auscultation:  Clear without  wheezing or rhonchi Cardiovascular  Auscultation:  Regular rate, without rubs, murmurs or gallops  Edema/varicosities:  Not grossly evident Abdominal  Soft,nontender, without masses, guarding or rebound.  Liver/spleen:  No organomegaly noted  Hernia:  None appreciated  Skin  Inspection:  Grossly normal Breasts: Examined lying and sitting.   Right: Without masses, retractions, nipple discharge or axillary adenopathy.   Left: Without masses, retractions, nipple discharge or axillary adenopathy. Genitourinary   Inguinal/mons:  Normal without inguinal adenopathy  External genitalia:  Normal appearing vulva with no masses, tenderness, or lesions  BUS/Urethra/Skene's glands:  Normal  Vagina:  Normal appearing with normal color and discharge, no lesions. Atrophy: mild   Cervix:  Normal appearing without discharge or lesions  Uterus:  Normal in size, shape and contour.  Midline and mobile, nontender  Adnexa/parametria:     Rt: Normal in size, without masses or tenderness.   Lt: Normal in size, without masses or tenderness.  Anus and perineum: Normal    Darice Hoit, CMA present for exam  Urine dipstick shows negative for all components.  Micro exam: negative for WBC's or RBC's.   Assessment/Plan:   1. Well woman exam with routine gynecological exam (Primary) Pap due 2026 Labs to be done with PCP  2. Hormone replacement  therapy (HRT) Will increase estrogen for bone and heart health - progesterone  (PROMETRIUM ) 100 MG capsule; Take 1 capsule (100 mg total) by mouth at bedtime.  Dispense: 90 capsule; Refill: 4 - estradiol  (VIVELLE -DOT) 0.0375 MG/24HR; Place 1 patch onto the skin 2 (two) times a week.  Dispense: 24 patch; Refill: 4  3. Burning with urination - Urinalysis,Complete w/RFL Culture  4. Lichen sclerosus et atrophicus of the vulva - clobetasol  cream (TEMOVATE ) 0.05 %; Apply 1 Application topically 2 (two) times a week.  Dispense: 30 g; Refill: 4  5. Vulvar atrophy -  estradiol  (ESTRACE  VAGINAL) 0.1 MG/GM vaginal cream; Place 1 g vaginally 2 (two) times a week. Apply 1 applicatorful to the vulva 3 times a week at bedtime  Dispense: 42.5 g; Refill: 6  6. Anxiety Well controlled on current dose, refill sent - FLUoxetine  (PROZAC ) 20 MG capsule; Take 1 capsule (20 mg total) by mouth daily.  Dispense: 90 capsule; Refill: 4  7. Depression screen    Return in 1 year for annual or sooner prn.  Suraj Ramdass B WHNP-BC, 9:43 AM 06/03/2024

## 2024-06-05 ENCOUNTER — Other Ambulatory Visit: Payer: Self-pay | Admitting: Radiology

## 2024-06-05 DIAGNOSIS — Z79890 Hormone replacement therapy (postmenopausal): Secondary | ICD-10-CM

## 2024-06-05 NOTE — Telephone Encounter (Signed)
 Med refill request: vivelle  0.025 Last AEX: 06/03/24 Next AEX: 06/08/25 Last MMG (if hormonal med) 12/31/23 BIRADS Cat 2 benign  Refill authorized: Refill not appropriate. original prescription was discontinued on 06/03/2024 by Chrzanowski, Jami B, NP for the following reason: Dose change. Routing to provider for review

## 2025-06-08 ENCOUNTER — Ambulatory Visit: Admitting: Radiology
